# Patient Record
Sex: Female | Born: 1970 | Race: White | Hispanic: No | Marital: Married | Smoking: Former smoker
Health system: Southern US, Community
[De-identification: ages and names within clinical notes are randomized; demographics above are authoritative.]

## PROBLEM LIST (undated history)

## (undated) DIAGNOSIS — R51 Headache: Principal | ICD-10-CM

## (undated) DIAGNOSIS — S161XXA Strain of muscle, fascia and tendon at neck level, initial encounter: Secondary | ICD-10-CM

## (undated) HISTORY — PX: AUGMENTATION MAMMAPLASTY: SUR837

## (undated) HISTORY — PX: RHINOPLASTY: SUR1284

## (undated) HISTORY — DX: Strain of muscle, fascia and tendon at neck level, initial encounter: S16.1XXA

## (undated) HISTORY — DX: Headache: R51

---

## 1998-05-20 ENCOUNTER — Other Ambulatory Visit: Admission: RE | Admit: 1998-05-20 | Discharge: 1998-05-20 | Payer: Self-pay | Admitting: Obstetrics and Gynecology

## 1999-09-02 ENCOUNTER — Other Ambulatory Visit: Admission: RE | Admit: 1999-09-02 | Discharge: 1999-09-02 | Payer: Self-pay | Admitting: Obstetrics and Gynecology

## 2000-09-04 ENCOUNTER — Other Ambulatory Visit: Admission: RE | Admit: 2000-09-04 | Discharge: 2000-09-04 | Payer: Self-pay | Admitting: Obstetrics and Gynecology

## 2001-09-06 ENCOUNTER — Other Ambulatory Visit: Admission: RE | Admit: 2001-09-06 | Discharge: 2001-09-06 | Payer: Self-pay | Admitting: Obstetrics and Gynecology

## 2001-11-29 ENCOUNTER — Emergency Department (HOSPITAL_COMMUNITY): Admission: EM | Admit: 2001-11-29 | Discharge: 2001-11-29 | Payer: Self-pay | Admitting: Emergency Medicine

## 2002-09-24 ENCOUNTER — Other Ambulatory Visit: Admission: RE | Admit: 2002-09-24 | Discharge: 2002-09-24 | Payer: Self-pay | Admitting: Obstetrics and Gynecology

## 2002-11-07 ENCOUNTER — Encounter: Admission: RE | Admit: 2002-11-07 | Discharge: 2002-11-07 | Payer: Self-pay | Admitting: Obstetrics and Gynecology

## 2002-11-07 ENCOUNTER — Encounter: Payer: Self-pay | Admitting: Obstetrics and Gynecology

## 2003-11-14 ENCOUNTER — Other Ambulatory Visit: Admission: RE | Admit: 2003-11-14 | Discharge: 2003-11-14 | Payer: Self-pay | Admitting: Obstetrics and Gynecology

## 2004-12-13 ENCOUNTER — Other Ambulatory Visit: Admission: RE | Admit: 2004-12-13 | Discharge: 2004-12-13 | Payer: Self-pay | Admitting: Obstetrics and Gynecology

## 2005-12-20 ENCOUNTER — Other Ambulatory Visit: Admission: RE | Admit: 2005-12-20 | Discharge: 2005-12-20 | Payer: Self-pay | Admitting: Obstetrics and Gynecology

## 2010-06-11 ENCOUNTER — Encounter: Admission: RE | Admit: 2010-06-11 | Discharge: 2010-06-11 | Payer: Self-pay | Admitting: Obstetrics and Gynecology

## 2010-10-09 ENCOUNTER — Encounter: Payer: Self-pay | Admitting: Family Medicine

## 2011-05-12 ENCOUNTER — Other Ambulatory Visit: Payer: Self-pay | Admitting: Obstetrics and Gynecology

## 2011-05-12 DIAGNOSIS — Z1231 Encounter for screening mammogram for malignant neoplasm of breast: Secondary | ICD-10-CM

## 2011-06-14 ENCOUNTER — Ambulatory Visit
Admission: RE | Admit: 2011-06-14 | Discharge: 2011-06-14 | Disposition: A | Discharge status: Home / Self Care | Payer: BC Managed Care – PPO | Source: Ambulatory Visit | Attending: Obstetrics and Gynecology | Admitting: Obstetrics and Gynecology

## 2011-06-14 DIAGNOSIS — Z1231 Encounter for screening mammogram for malignant neoplasm of breast: Secondary | ICD-10-CM

## 2012-05-22 ENCOUNTER — Other Ambulatory Visit: Payer: Self-pay | Admitting: Obstetrics and Gynecology

## 2012-05-22 DIAGNOSIS — Z1231 Encounter for screening mammogram for malignant neoplasm of breast: Secondary | ICD-10-CM

## 2012-06-14 ENCOUNTER — Ambulatory Visit
Admission: RE | Admit: 2012-06-14 | Discharge: 2012-06-14 | Disposition: A | Discharge status: Home / Self Care | Payer: BC Managed Care – PPO | Source: Ambulatory Visit | Attending: Obstetrics and Gynecology | Admitting: Obstetrics and Gynecology

## 2012-06-14 DIAGNOSIS — Z1231 Encounter for screening mammogram for malignant neoplasm of breast: Secondary | ICD-10-CM

## 2013-07-01 ENCOUNTER — Other Ambulatory Visit: Payer: Self-pay

## 2013-07-01 DIAGNOSIS — Z1231 Encounter for screening mammogram for malignant neoplasm of breast: Secondary | ICD-10-CM

## 2013-07-22 ENCOUNTER — Ambulatory Visit
Admission: RE | Admit: 2013-07-22 | Discharge: 2013-07-22 | Disposition: A | Discharge status: Home / Self Care | Payer: BC Managed Care – PPO | Source: Ambulatory Visit

## 2013-07-22 DIAGNOSIS — Z1231 Encounter for screening mammogram for malignant neoplasm of breast: Secondary | ICD-10-CM

## 2014-06-24 ENCOUNTER — Other Ambulatory Visit: Payer: Self-pay

## 2014-06-24 DIAGNOSIS — Z1239 Encounter for other screening for malignant neoplasm of breast: Secondary | ICD-10-CM

## 2014-07-11 ENCOUNTER — Other Ambulatory Visit: Payer: Self-pay

## 2014-07-11 DIAGNOSIS — Z1231 Encounter for screening mammogram for malignant neoplasm of breast: Secondary | ICD-10-CM

## 2014-08-25 ENCOUNTER — Ambulatory Visit
Admission: RE | Admit: 2014-08-25 | Discharge: 2014-08-25 | Disposition: A | Discharge status: Home / Self Care | Payer: BC Managed Care – PPO | Source: Ambulatory Visit

## 2014-08-25 DIAGNOSIS — Z1231 Encounter for screening mammogram for malignant neoplasm of breast: Secondary | ICD-10-CM

## 2014-08-29 ENCOUNTER — Other Ambulatory Visit: Payer: Self-pay | Admitting: Family Medicine

## 2014-08-29 DIAGNOSIS — N644 Mastodynia: Secondary | ICD-10-CM

## 2014-09-10 ENCOUNTER — Other Ambulatory Visit: Payer: BC Managed Care – PPO

## 2014-09-18 ENCOUNTER — Ambulatory Visit
Admission: RE | Admit: 2014-09-18 | Discharge: 2014-09-18 | Disposition: A | Discharge status: Home / Self Care | Payer: BC Managed Care – PPO | Source: Ambulatory Visit | Attending: Family Medicine | Admitting: Family Medicine

## 2014-09-18 ENCOUNTER — Other Ambulatory Visit: Payer: Self-pay | Admitting: Family Medicine

## 2014-09-18 DIAGNOSIS — N644 Mastodynia: Secondary | ICD-10-CM

## 2014-12-18 ENCOUNTER — Other Ambulatory Visit: Payer: Self-pay

## 2014-12-18 DIAGNOSIS — D241 Benign neoplasm of right breast: Secondary | ICD-10-CM

## 2014-12-18 DIAGNOSIS — N6310 Unspecified lump in the right breast, unspecified quadrant: Secondary | ICD-10-CM

## 2015-03-11 ENCOUNTER — Ambulatory Visit (INDEPENDENT_AMBULATORY_CARE_PROVIDER_SITE_OTHER): Payer: BLUE CROSS/BLUE SHIELD | Admitting: Neurology

## 2015-03-11 ENCOUNTER — Encounter: Payer: Self-pay | Admitting: Neurology

## 2015-03-11 VITALS — BP 104/70 | HR 78 | Ht 64.0 in | Wt 141.2 lb

## 2015-03-11 DIAGNOSIS — S161XXA Strain of muscle, fascia and tendon at neck level, initial encounter: Secondary | ICD-10-CM | POA: Insufficient documentation

## 2015-03-11 DIAGNOSIS — M542 Cervicalgia: Secondary | ICD-10-CM

## 2015-03-11 DIAGNOSIS — R51 Headache: Secondary | ICD-10-CM | POA: Diagnosis not present

## 2015-03-11 DIAGNOSIS — R519 Headache, unspecified: Secondary | ICD-10-CM

## 2015-03-11 HISTORY — DX: Strain of muscle, fascia and tendon at neck level, initial encounter: S16.1XXA

## 2015-03-11 HISTORY — DX: Headache, unspecified: R51.9

## 2015-03-11 NOTE — Progress Notes (Signed)
Reason for visit: Neck pain  Referring physician: Dr. Leota Jacobsen is a 44 y.o. female  History of present illness:  Ms. Vandagriff is a 44 year old right-handed white female with a history of neck discomfort going back several years. Within the last year, the discomfort has significant increased. The patient reports pain at the base of the neck, and the pain may alternate from one side to the next. The pain is usually not on both sides at the same time. She indicates that the pain is directly related to stress at work. The pain will begin on Thursday, and continue until Saturday morning. The pain is associated with spasm and trigger points at the base of the skull, the patient will have shooting and tingling pain into the maxillary areas. She will have some discomfort down into the shoulder. She indicates that if she is using the computer for long times, this will bring on discomfort. If she is active, up and walking, this improves the symptoms. If she goes on vacation, the symptoms will reduce significantly. She denies any pain down the arms. 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 She has been seen by [facility], and this has been beneficial. She may use heat or ice on the neck with some benefit, and she also uses Advil and she may take some occasional hydrocodone or alprazolam. Medications such as Flexeril resulted in too much drowsiness. Sunday through Wednesday, the patient is totally asymptomatic. She has undergone MRI evaluation of the cervical spine, this study shows congenitally short pedicles with some degenerative disc disease at the C5-6 and C6-7 levels, some left-sided neuroforaminal stenosis is noted, no definite nerve root impingement is seen. She comes to this office for an evaluation. She is being considered for epidural steroid injections.  Past Medical History  Diagnosis Date  . Headache disorder  03/11/2015  . Cervical strain 03/11/2015    Past Surgical History  Procedure Laterality Date  . Rhinoplasty      Family History  Problem Relation Age of Onset  . Stroke Maternal Grandmother   . Colon cancer Paternal Grandmother     Social history:  reports that she quit smoking about 27 years ago. She does not have any smokeless tobacco history on file. She reports that she does not drink alcohol or use illicit drugs.  Medications:  Prior to Admission medications   Medication Sig Start Date End Date Taking? Authorizing Provider  ALPRAZolam Duanne Moron) 0.5 MG tablet Take 1-1.5 tablets by mouth at bedtime. 01/09/15  Yes Historical Provider, MD  HYDROcodone-acetaminophen (NORCO/VICODIN) 5-325 MG per tablet Take 1-2 tablets by mouth at bedtime. 01/29/15  Yes Historical Provider, MD  Ibuprofen (ADVIL) 200 MG CAPS Take 600 mg by mouth 2 (two) times daily.   Yes Historical Provider, MD  mometasone (NASONEX) 50 MCG/ACT nasal spray Place 2 sprays into the nose daily.   Yes Historical Provider, MD  spironolactone (ALDACTONE) 50 MG tablet Take 150 mg by mouth daily.   Yes Historical Provider, MD      Allergies  Allergen Reactions  . Aleve [Naproxen Sodium]   . Epinephrine     ROS:  Out of a complete 14 system review of symptoms, the patient complains only of the following symptoms, and all other reviewed systems are negative.  Neck pain, tingling  Blood pressure 104/70, pulse 78, height 5\' 4"  (1.626 m), weight 141 lb 3.2 oz (64.048 kg).  Physical Exam  General: The patient is alert and cooperative at the time of the examination.  Eyes: Pupils are equal, round, and reactive to light. Discs are flat bilaterally.  Neck: The neck is supple, no carotid bruits are noted.  Respiratory: The respiratory examination is clear.  Cardiovascular: The cardiovascular examination reveals a regular rate and rhythm, no obvious murmurs or rubs are noted.  Neuromuscular: Range of movement of the  cervical spine is full. Bilateral crepitus is noted in the temporomandibular joints.  Skin: Extremities are without significant edema.  Neurologic Exam  Mental status: The patient is alert and oriented x 3 at the time of the examination. The patient has apparent normal recent and remote memory, with an apparently normal attention span and concentration ability.  Cranial nerves: Facial symmetry is present. There is good sensation of the face to pinprick and soft touch bilaterally. The strength of the facial muscles and the muscles to head turning and shoulder shrug are normal bilaterally. Speech is well enunciated, no aphasia or dysarthria is noted. Extraocular movements are full. Visual fields are full. The tongue is midline, and the patient has symmetric elevation of the soft palate. No obvious hearing deficits are noted.  Motor: The motor testing reveals 5 over 5 strength of all 4 extremities. Good symmetric motor tone is noted throughout.  Sensory: Sensory testing is intact to pinprick, soft touch, vibration sensation, and position sense on all 4 extremities. No evidence of extinction is noted.  Coordination: Cerebellar testing reveals good finger-nose-finger and heel-to-shin bilaterally.  Gait and station: Gait is normal. Tandem gait is normal. Romberg is negative. No drift is seen.  Reflexes: Deep tendon reflexes are symmetric and normal bilaterally. Toes are downgoing bilaterally.   Assessment/Plan:  1. Neuromuscular discomfort, cervical strain  2. Mild cervical spondylosis  The patient is having neuromuscular discomfort associated with her work stress. I have recommended that she frequently stretch throughout the day while working. I recommended going on low-dose gabapentin, and the patient may go on to get an epidural steroid injection. The patient does not wish to go on oral daily medications, but she will contact me if she changes her mind. If the symptoms are not improving, MRI  of the brain may be done in the future.  Jill Alexanders MD 03/11/2015 8:19 PM  [facility] [address] [place], [place] [address]  Phone [phone] Fax [phone]

## 2015-03-11 NOTE — Patient Instructions (Signed)

## 2015-03-17 ENCOUNTER — Ambulatory Visit: Payer: BLUE CROSS/BLUE SHIELD | Admitting: Neurology

## 2015-03-26 ENCOUNTER — Other Ambulatory Visit: Payer: Self-pay | Admitting: Obstetrics and Gynecology

## 2015-03-26 DIAGNOSIS — N6310 Unspecified lump in the right breast, unspecified quadrant: Secondary | ICD-10-CM

## 2015-03-27 ENCOUNTER — Ambulatory Visit
Admission: RE | Admit: 2015-03-27 | Discharge: 2015-03-27 | Disposition: A | Discharge status: Home / Self Care | Payer: BLUE CROSS/BLUE SHIELD | Source: Ambulatory Visit

## 2015-03-27 ENCOUNTER — Other Ambulatory Visit: Payer: Self-pay | Admitting: Obstetrics and Gynecology

## 2015-03-27 DIAGNOSIS — N6310 Unspecified lump in the right breast, unspecified quadrant: Secondary | ICD-10-CM

## 2015-08-26 ENCOUNTER — Other Ambulatory Visit: Payer: Self-pay

## 2015-08-26 DIAGNOSIS — Z1231 Encounter for screening mammogram for malignant neoplasm of breast: Secondary | ICD-10-CM

## 2015-09-24 ENCOUNTER — Ambulatory Visit: Payer: BLUE CROSS/BLUE SHIELD

## 2015-10-23 LAB — LIPID PANEL
Cholesterol: 198 (ref 0–200)
HDL: 42 (ref 35–70)
LDL CALC: 130
Triglycerides: 131 (ref 40–160)

## 2015-10-23 LAB — BASIC METABOLIC PANEL
BUN: 15 (ref 4–21)
Creatinine: 0.7 (ref 0.5–1.1)
GLUCOSE: 94
POTASSIUM: 4.4 (ref 3.4–5.3)
Sodium: 136 — AB (ref 137–147)

## 2015-10-23 LAB — HEPATIC FUNCTION PANEL
ALT: 12 (ref 7–35)
AST: 15 (ref 13–35)
Alkaline Phosphatase: 51 (ref 25–125)
Bilirubin, Total: 0.5

## 2015-11-04 ENCOUNTER — Ambulatory Visit
Admission: RE | Admit: 2015-11-04 | Discharge: 2015-11-04 | Disposition: A | Discharge status: Home / Self Care | Payer: BLUE CROSS/BLUE SHIELD | Source: Ambulatory Visit

## 2015-11-04 DIAGNOSIS — Z1231 Encounter for screening mammogram for malignant neoplasm of breast: Secondary | ICD-10-CM

## 2016-09-28 ENCOUNTER — Other Ambulatory Visit: Payer: Self-pay | Admitting: Obstetrics and Gynecology

## 2016-09-28 DIAGNOSIS — Z1231 Encounter for screening mammogram for malignant neoplasm of breast: Secondary | ICD-10-CM

## 2016-10-19 ENCOUNTER — Telehealth (INDEPENDENT_AMBULATORY_CARE_PROVIDER_SITE_OTHER): Payer: Self-pay | Admitting: Orthopaedic Surgery

## 2016-10-19 ENCOUNTER — Other Ambulatory Visit (INDEPENDENT_AMBULATORY_CARE_PROVIDER_SITE_OTHER): Payer: Self-pay | Admitting: Orthopaedic Surgery

## 2016-10-19 MED ORDER — HYDROCODONE-ACETAMINOPHEN 5-325 MG PO TABS: 1.0000 | ORAL_TABLET | Freq: Two times a day (BID) | ORAL | 0 refills | Status: DC | PRN

## 2016-10-19 NOTE — Telephone Encounter (Signed)
LMOM for patient of the below message  

## 2016-10-19 NOTE — Telephone Encounter (Signed)
Can come and pick up one more, but next time, she will have to be seen as a patient due to the new narcotic prescribing laws

## 2016-10-19 NOTE — Telephone Encounter (Signed)
Please advise 

## 2016-10-19 NOTE — Telephone Encounter (Signed)
Patient called to request a medication refill on her pain medicine Vicodin.  She also wanted you to know that she took your advice and switched to a less stressful job, but still having some issues that she can't shake.

## 2016-10-19 NOTE — Telephone Encounter (Signed)
Sorry Patricia Duke's Cb#[phone].  Sorry I left her number off the last message.  Thank you.

## 2016-11-23 ENCOUNTER — Ambulatory Visit
Admission: RE | Admit: 2016-11-23 | Discharge: 2016-11-23 | Disposition: A | Discharge status: Home / Self Care | Payer: 59 | Source: Ambulatory Visit | Attending: Obstetrics and Gynecology | Admitting: Obstetrics and Gynecology

## 2016-11-23 ENCOUNTER — Other Ambulatory Visit: Payer: Self-pay | Admitting: Obstetrics and Gynecology

## 2016-11-23 DIAGNOSIS — Z1231 Encounter for screening mammogram for malignant neoplasm of breast: Secondary | ICD-10-CM

## 2017-01-18 DIAGNOSIS — Z6824 Body mass index (BMI) 24.0-24.9, adult: Secondary | ICD-10-CM | POA: Diagnosis not present

## 2017-01-18 DIAGNOSIS — Z01419 Encounter for gynecological examination (general) (routine) without abnormal findings: Secondary | ICD-10-CM | POA: Diagnosis not present

## 2017-02-21 ENCOUNTER — Ambulatory Visit (INDEPENDENT_AMBULATORY_CARE_PROVIDER_SITE_OTHER): Payer: 59

## 2017-02-21 ENCOUNTER — Other Ambulatory Visit (INDEPENDENT_AMBULATORY_CARE_PROVIDER_SITE_OTHER): Payer: Self-pay

## 2017-02-21 ENCOUNTER — Telehealth (INDEPENDENT_AMBULATORY_CARE_PROVIDER_SITE_OTHER): Payer: Self-pay | Admitting: Orthopaedic Surgery

## 2017-02-21 ENCOUNTER — Ambulatory Visit (INDEPENDENT_AMBULATORY_CARE_PROVIDER_SITE_OTHER): Payer: 59 | Admitting: Orthopaedic Surgery

## 2017-02-21 DIAGNOSIS — M5441 Lumbago with sciatica, right side: Secondary | ICD-10-CM | POA: Diagnosis not present

## 2017-02-21 DIAGNOSIS — M542 Cervicalgia: Secondary | ICD-10-CM

## 2017-02-21 DIAGNOSIS — M5442 Lumbago with sciatica, left side: Secondary | ICD-10-CM

## 2017-02-21 MED ORDER — HYDROCODONE-ACETAMINOPHEN 5-325 MG PO TABS: 1.0000 | ORAL_TABLET | Freq: Two times a day (BID) | ORAL | 0 refills | Status: AC | PRN

## 2017-02-21 MED ORDER — GABAPENTIN 300 MG PO CAPS: 300.0000 mg | ORAL_CAPSULE | Freq: Every day | ORAL | 0 refills | Status: DC

## 2017-02-21 NOTE — Progress Notes (Signed)
Office Visit Note   Patient: Patricia Duke           Date of Birth: [birth year]           MRN: [id number] Visit Date: 02/21/2017              Requested by: Kathyrn Lass, [place], [place] [address] PCP: Kathyrn Lass, MD   Assessment & Plan: Visit Diagnoses:  1. Acute right-sided low back pain with right-sided sciatica   2. Neck pain     Plan: Given her worsening pain combined with now the significant radicular symptoms going down her right leg and MRI is warranted to rule out herniated disc. I'm certainly concerned of her plain films showing degenerative disc disease at L4-L5 and the fact that I see narrowing of of the foramina at that level that she is experiencing nerve compression. She is already tried and failed all forms of conservative treatment including rest, ice, heat, anti-inflammatories when she can tolerate these without GI issues. She's also been to a chiropractor all times. At this point I feel that she would benefit from an MRI to see where the nerve is getting compressed so we can design intervention for her. I will try 300 mg of Neurontin at night and have her take this about a week which she can go up to twice a day if needed. I did refill her hydrocodone but only gave her 40 pills and counseled her about narcotics use. We'll have her follow-up after the MRI of her lumbar spine.  Follow-Up Instructions: Return in about 2 weeks (around 03/07/2017).   Orders:  Orders Placed This Encounter  Procedures  . XR Lumbar Spine 2-3 Views  . XR Cervical Spine 2 or 3 views   Meds ordered this encounter  Medications  . gabapentin (NEURONTIN) 300 MG capsule    Sig: Take 1 capsule (300 mg total) by mouth at bedtime.    Dispense:  60 capsule    Refill:  0  . HYDROcodone-acetaminophen (NORCO/VICODIN) 5-325 MG tablet    Sig: Take 1 tablet by mouth 2 (two) times daily as needed for moderate pain.    Dispense:  40 tablet    Refill:  0      Procedures: No  procedures performed   Clinical Data: No additional findings.   Subjective: No chief complaint on file. Patient is well-known to our practice. She is seen in my self as well as a partner of mine for many years now. She's had chronic neck issues and chronic headaches but now she's been having problems with low back pain and radicular symptoms going down her right leg. This is been slowly getting worse with time. She has tried rest, ice, heat and activity modification. She goes to a chiropractor regularly and has therapy as well. Her pain is getting worse. She tries anti-inflammatories but it does cause significant gastritis and she said initially with ulcers in the past. She does take hydrocodone on occasion and we reviewed the EEA data base and does not show she is getting anywhere except for here and we don't feel this but very limited times. She describes the pain going down her right leg is in her backside and goes down the side of her leg on the right side. She is developing some weakness and feel like the legs giving way as well. She is also just chronic neck issue and pain in general she's had TMJ in the past as well  as again chronic headaches which she sees a specialist for. She does take Xanax and Skelaxin. She denies any change in bowel or bladder function.  HPI  Review of Systems She currently denies any short of breath or chest pain. She denies any fever, chills, nausea, vomiting.  Objective: Vital Signs: There were no vitals taken for this visit.  Physical Exam She is alert and oriented 3 and in no acute distress Ortho Exam On examination of her lumbar spine she has significantly limited flexion-extension. Her hamstrings are incredibly tight and when she bends over even being a thin individual she can only touch about her ankles. Her extension also causes a significant amount of pain and is limited. She has a positive straight leg raise to the right side. She has pain across her  lower aspect lumbar spine to palpation of both the facet joints of L4-5 and L5-S1. She has subjective numbness down the lateral aspect of her right leg. Specialty Comments:  No specialty comments available.  Imaging: Xr Cervical Spine 2 Or 3 Views  Result Date: 02/21/2017 An AP and lateral of the cervical spine shows some mild degenerative changes. On the lateral view she has some loss of her lumbar lordosis and on the AP view you can see some arthritic changes and facet joints of the lower aspect of cervical spine to the left.  Xr Lumbar Spine 2-3 Views  Result Date: 02/21/2017 An AP and lateral lumbar spine shows significant degenerative disc disease at L4-L5.    PMFS History: Patient Active Problem List   Diagnosis Date Noted  . Headache disorder 03/11/2015  . Cervical strain 03/11/2015   Past Medical History:  Diagnosis Date  . Cervical strain 03/11/2015  . Headache disorder 03/11/2015    Family History  Problem Relation Age of Onset  . Stroke Maternal Grandmother   . Colon cancer Paternal Grandmother   . Breast cancer Neg Hx     Past Surgical History:  Procedure Laterality Date  . RHINOPLASTY     Social History   Occupational History  . [employer]    Social History Main Topics  . Smoking status: Former Smoker    Quit date: 09/20/1987  . Smokeless tobacco: Not on file  . Alcohol use No  . Drug use: No  . Sexual activity: Not on file

## 2017-02-21 NOTE — Telephone Encounter (Signed)
Patient is requesting a open MRI she is very claustrophobic. CB # [phone]

## 2017-02-22 NOTE — Telephone Encounter (Signed)
done

## 2017-03-07 ENCOUNTER — Other Ambulatory Visit: Payer: 59

## 2017-03-08 ENCOUNTER — Ambulatory Visit (INDEPENDENT_AMBULATORY_CARE_PROVIDER_SITE_OTHER): Payer: 59 | Admitting: Orthopaedic Surgery

## 2017-03-27 ENCOUNTER — Ambulatory Visit (INDEPENDENT_AMBULATORY_CARE_PROVIDER_SITE_OTHER): Payer: 59 | Admitting: Orthopaedic Surgery

## 2017-04-05 DIAGNOSIS — L689 Hypertrichosis, unspecified: Secondary | ICD-10-CM | POA: Diagnosis not present

## 2017-04-05 DIAGNOSIS — K219 Gastro-esophageal reflux disease without esophagitis: Secondary | ICD-10-CM | POA: Diagnosis not present

## 2017-04-05 DIAGNOSIS — E782 Mixed hyperlipidemia: Secondary | ICD-10-CM | POA: Diagnosis not present

## 2017-04-05 DIAGNOSIS — G4700 Insomnia, unspecified: Secondary | ICD-10-CM | POA: Diagnosis not present

## 2017-04-05 DIAGNOSIS — Z79899 Other long term (current) drug therapy: Secondary | ICD-10-CM | POA: Diagnosis not present

## 2017-04-05 DIAGNOSIS — M542 Cervicalgia: Secondary | ICD-10-CM | POA: Diagnosis not present

## 2017-04-05 DIAGNOSIS — M545 Low back pain: Secondary | ICD-10-CM | POA: Diagnosis not present

## 2017-04-05 LAB — HEPATIC FUNCTION PANEL
ALK PHOS: 55 (ref 25–125)
ALT: 13 (ref 7–35)
AST: 18 (ref 13–35)
Bilirubin, Total: 0.3

## 2017-04-05 LAB — BASIC METABOLIC PANEL
BUN: 12 (ref 4–21)
Creatinine: 0.7 (ref 0.5–1.1)
Glucose: 99
Potassium: 5 (ref 3.4–5.3)
Sodium: 138 (ref 137–147)

## 2017-04-05 LAB — LIPID PANEL
CHOLESTEROL: 228 — AB (ref 0–200)
HDL: 50 (ref 35–70)
LDL CALC: 158
TRIGLYCERIDES: 101 (ref 40–160)

## 2017-04-11 ENCOUNTER — Other Ambulatory Visit: Payer: 59

## 2017-04-20 ENCOUNTER — Other Ambulatory Visit (INDEPENDENT_AMBULATORY_CARE_PROVIDER_SITE_OTHER): Payer: Self-pay | Admitting: Orthopaedic Surgery

## 2017-04-20 NOTE — Telephone Encounter (Signed)
Please advise 

## 2017-04-24 ENCOUNTER — Telehealth (INDEPENDENT_AMBULATORY_CARE_PROVIDER_SITE_OTHER): Payer: Self-pay | Admitting: Orthopaedic Surgery

## 2017-04-24 NOTE — Telephone Encounter (Signed)
Done. Patient notified and is aware of $5 fee.

## 2017-04-24 NOTE — Telephone Encounter (Signed)
Patient called needing X-Rays from her June visit. Patient advised will pick up. The number to contact patient is [phone]

## 2017-05-03 DIAGNOSIS — M5136 Other intervertebral disc degeneration, lumbar region: Secondary | ICD-10-CM | POA: Diagnosis not present

## 2017-05-03 DIAGNOSIS — G8929 Other chronic pain: Secondary | ICD-10-CM | POA: Diagnosis not present

## 2017-05-03 DIAGNOSIS — M47812 Spondylosis without myelopathy or radiculopathy, cervical region: Secondary | ICD-10-CM | POA: Diagnosis not present

## 2017-05-03 DIAGNOSIS — M47816 Spondylosis without myelopathy or radiculopathy, lumbar region: Secondary | ICD-10-CM | POA: Diagnosis not present

## 2017-05-03 DIAGNOSIS — M791 Myalgia: Secondary | ICD-10-CM | POA: Diagnosis not present

## 2017-05-03 DIAGNOSIS — Z79891 Long term (current) use of opiate analgesic: Secondary | ICD-10-CM | POA: Diagnosis not present

## 2017-05-23 DIAGNOSIS — M47816 Spondylosis without myelopathy or radiculopathy, lumbar region: Secondary | ICD-10-CM | POA: Diagnosis not present

## 2017-05-23 DIAGNOSIS — M5136 Other intervertebral disc degeneration, lumbar region: Secondary | ICD-10-CM | POA: Diagnosis not present

## 2017-05-23 DIAGNOSIS — Z886 Allergy status to analgesic agent status: Secondary | ICD-10-CM | POA: Diagnosis not present

## 2017-06-28 DIAGNOSIS — M5136 Other intervertebral disc degeneration, lumbar region: Secondary | ICD-10-CM | POA: Diagnosis not present

## 2017-06-28 DIAGNOSIS — M47816 Spondylosis without myelopathy or radiculopathy, lumbar region: Secondary | ICD-10-CM | POA: Diagnosis not present

## 2017-06-28 DIAGNOSIS — Z79891 Long term (current) use of opiate analgesic: Secondary | ICD-10-CM | POA: Diagnosis not present

## 2017-06-28 DIAGNOSIS — G8929 Other chronic pain: Secondary | ICD-10-CM | POA: Diagnosis not present

## 2017-06-28 DIAGNOSIS — M7918 Myalgia, other site: Secondary | ICD-10-CM | POA: Diagnosis not present

## 2017-06-28 DIAGNOSIS — M47812 Spondylosis without myelopathy or radiculopathy, cervical region: Secondary | ICD-10-CM | POA: Diagnosis not present

## 2017-10-17 ENCOUNTER — Other Ambulatory Visit: Payer: Self-pay | Admitting: Obstetrics and Gynecology

## 2017-10-17 DIAGNOSIS — Z1231 Encounter for screening mammogram for malignant neoplasm of breast: Secondary | ICD-10-CM

## 2017-11-14 DIAGNOSIS — D229 Melanocytic nevi, unspecified: Secondary | ICD-10-CM | POA: Diagnosis not present

## 2017-11-14 DIAGNOSIS — L821 Other seborrheic keratosis: Secondary | ICD-10-CM | POA: Diagnosis not present

## 2017-11-22 DIAGNOSIS — M47892 Other spondylosis, cervical region: Secondary | ICD-10-CM | POA: Diagnosis not present

## 2017-11-22 DIAGNOSIS — M47896 Other spondylosis, lumbar region: Secondary | ICD-10-CM | POA: Diagnosis not present

## 2017-11-22 DIAGNOSIS — M7918 Myalgia, other site: Secondary | ICD-10-CM | POA: Diagnosis not present

## 2017-11-22 DIAGNOSIS — M5136 Other intervertebral disc degeneration, lumbar region: Secondary | ICD-10-CM | POA: Diagnosis not present

## 2017-11-22 DIAGNOSIS — M47812 Spondylosis without myelopathy or radiculopathy, cervical region: Secondary | ICD-10-CM | POA: Diagnosis not present

## 2017-11-22 DIAGNOSIS — G8929 Other chronic pain: Secondary | ICD-10-CM | POA: Diagnosis not present

## 2017-11-22 DIAGNOSIS — M542 Cervicalgia: Secondary | ICD-10-CM | POA: Diagnosis not present

## 2017-11-22 DIAGNOSIS — M5186 Other intervertebral disc disorders, lumbar region: Secondary | ICD-10-CM | POA: Diagnosis not present

## 2017-11-22 DIAGNOSIS — G894 Chronic pain syndrome: Secondary | ICD-10-CM | POA: Diagnosis not present

## 2017-11-22 DIAGNOSIS — M50322 Other cervical disc degeneration at C5-C6 level: Secondary | ICD-10-CM | POA: Diagnosis not present

## 2017-11-22 DIAGNOSIS — M47816 Spondylosis without myelopathy or radiculopathy, lumbar region: Secondary | ICD-10-CM | POA: Diagnosis not present

## 2017-11-22 DIAGNOSIS — M4182 Other forms of scoliosis, cervical region: Secondary | ICD-10-CM | POA: Diagnosis not present

## 2017-11-22 DIAGNOSIS — Z79891 Long term (current) use of opiate analgesic: Secondary | ICD-10-CM | POA: Diagnosis not present

## 2017-12-05 ENCOUNTER — Encounter: Payer: Self-pay | Admitting: Family Medicine

## 2017-12-05 ENCOUNTER — Ambulatory Visit: Payer: 59 | Admitting: Family Medicine

## 2017-12-05 VITALS — BP 102/62 | HR 90 | Ht 64.0 in | Wt 138.8 lb

## 2017-12-05 DIAGNOSIS — M545 Low back pain, unspecified: Secondary | ICD-10-CM | POA: Insufficient documentation

## 2017-12-05 DIAGNOSIS — G8929 Other chronic pain: Secondary | ICD-10-CM

## 2017-12-05 DIAGNOSIS — R69 Illness, unspecified: Secondary | ICD-10-CM | POA: Diagnosis not present

## 2017-12-05 DIAGNOSIS — F5101 Primary insomnia: Secondary | ICD-10-CM

## 2017-12-05 DIAGNOSIS — Z0000 Encounter for general adult medical examination without abnormal findings: Secondary | ICD-10-CM

## 2017-12-05 DIAGNOSIS — E7800 Pure hypercholesterolemia, unspecified: Secondary | ICD-10-CM

## 2017-12-05 DIAGNOSIS — F1320 Sedative, hypnotic or anxiolytic dependence, uncomplicated: Secondary | ICD-10-CM

## 2017-12-05 LAB — LIPID PANEL
CHOLESTEROL: 264 mg/dL — AB (ref 0–200)
HDL: 45.6 mg/dL (ref >39.00)
LDL CALC: 199 mg/dL — AB (ref 0–99)
NonHDL: 217.99
TRIGLYCERIDES: 94 mg/dL (ref 0.0–149.0)
Total CHOL/HDL Ratio: 6
VLDL: 18.8 mg/dL (ref 0.0–40.0)

## 2017-12-05 LAB — URINALYSIS, ROUTINE W REFLEX MICROSCOPIC
BILIRUBIN URINE: NEGATIVE
KETONES UR: NEGATIVE
Leukocytes, UA: NEGATIVE
NITRITE: NEGATIVE
Specific Gravity, Urine: 1.025 (ref 1.000–1.030)
Total Protein, Urine: NEGATIVE
Urine Glucose: NEGATIVE
Urobilinogen, UA: 0.2 (ref 0.0–1.0)
WBC, UA: NONE SEEN (ref 0–?)
pH: 5.5 (ref 5.0–8.0)

## 2017-12-05 LAB — CBC
HEMATOCRIT: 40.9 % (ref 36.0–46.0)
HEMOGLOBIN: 13.8 g/dL (ref 12.0–15.0)
MCHC: 33.7 g/dL (ref 30.0–36.0)
MCV: 97.9 fl (ref 78.0–100.0)
Platelets: 286 10*3/uL (ref 150.0–400.0)
RBC: 4.18 Mil/uL (ref 3.87–5.11)
RDW: 12.3 % (ref 11.5–15.5)
WBC: 5.5 10*3/uL (ref 4.0–10.5)

## 2017-12-05 LAB — COMPREHENSIVE METABOLIC PANEL
ALT: 10 U/L (ref 0–35)
AST: 12 U/L (ref 0–37)
Albumin: 4.2 g/dL (ref 3.5–5.2)
Alkaline Phosphatase: 46 U/L (ref 39–117)
BUN: 16 mg/dL (ref 6–23)
CALCIUM: 9.4 mg/dL (ref 8.4–10.5)
CO2: 27 meq/L (ref 19–32)
CREATININE: 0.63 mg/dL (ref 0.40–1.20)
Chloride: 103 mEq/L (ref 96–112)
GFR: 107.38 mL/min (ref >60.00)
Glucose, Bld: 113 mg/dL — ABNORMAL HIGH (ref 70–99)
POTASSIUM: 4.6 meq/L (ref 3.5–5.1)
Sodium: 136 mEq/L (ref 135–145)
Total Bilirubin: 0.3 mg/dL (ref 0.2–1.2)
Total Protein: 6.8 g/dL (ref 6.0–8.3)

## 2017-12-05 LAB — TSH: TSH: 1.19 u[IU]/mL (ref 0.35–4.50)

## 2017-12-05 MED ORDER — ALPRAZOLAM 0.5 MG PO TABS: ORAL_TABLET | ORAL | 0 refills | Status: DC

## 2017-12-05 NOTE — Patient Instructions (Addendum)
Preventive Care 40-64 Years, Female Preventive care refers to lifestyle choices and visits with your health care provider that can promote health and wellness. What does preventive care include?  A yearly physical exam. This is also called an annual well check.  Dental exams once or twice a year.  Routine eye exams. Ask your health care provider how often you should have your eyes checked.  Personal lifestyle choices, including: ? Daily care of your teeth and gums. ? Regular physical activity. ? Eating a healthy diet. ? Avoiding tobacco and drug use. ? Limiting alcohol use. ? Practicing safe sex. ? Taking low-dose aspirin daily starting at age 58. ? Taking vitamin and mineral supplements as recommended by your health care provider. What happens during an annual well check? The services and screenings done by your health care provider during your annual well check will depend on your age, overall health, lifestyle risk factors, and family history of disease. Counseling Your health care provider may ask you questions about your:  Alcohol use.  Tobacco use.  Drug use.  Emotional well-being.  Home and relationship well-being.  Sexual activity.  Eating habits.  Work and work Statistician.  Method of birth control.  Menstrual cycle.  Pregnancy history.  Screening You may have the following tests or measurements:  Height, weight, and BMI.  Blood pressure.  Lipid and cholesterol levels. These may be checked every 5 years, or more frequently if you are over 81 years old.  Skin check.  Lung cancer screening. You may have this screening every year starting at age 78 if you have a 30-pack-year history of smoking and currently smoke or have quit within the past 15 years.  Fecal occult blood test (FOBT) of the stool. You may have this test every year starting at age 65.  Flexible sigmoidoscopy or colonoscopy. You may have a sigmoidoscopy every 5 years or a colonoscopy  every 10 years starting at age 30.  Hepatitis C blood test.  Hepatitis B blood test.  Sexually transmitted disease (STD) testing.  Diabetes screening. This is done by checking your blood sugar (glucose) after you have not eaten for a while (fasting). You may have this done every 1-3 years.  Mammogram. This may be done every 1-2 years. Talk to your health care provider about when you should start having regular mammograms. This may depend on whether you have a family history of breast cancer.  BRCA-related cancer screening. This may be done if you have a family history of breast, ovarian, tubal, or peritoneal cancers.  Pelvic exam and Pap test. This may be done every 3 years starting at age 80. Starting at age 36, this may be done every 5 years if you have a Pap test in combination with an HPV test.  Bone density scan. This is done to screen for osteoporosis. You may have this scan if you are at high risk for osteoporosis.  Discuss your test results, treatment options, and if necessary, the need for more tests with your health care provider. Vaccines Your health care provider may recommend certain vaccines, such as:  Influenza vaccine. This is recommended every year.  Tetanus, diphtheria, and acellular pertussis (Tdap, Td) vaccine. You may need a Td booster every 10 years.  Varicella vaccine. You may need this if you have not been vaccinated.  Zoster vaccine. You may need this after age 5.  Measles, mumps, and rubella (MMR) vaccine. You may need at least one dose of MMR if you were born in  1958 or later. You may also need a second dose.  Pneumococcal 13-valent conjugate (PCV13) vaccine. You may need this if you have certain conditions and were not previously vaccinated.  Pneumococcal polysaccharide (PPSV23) vaccine. You may need one or two doses if you smoke cigarettes or if you have certain conditions.  Meningococcal vaccine. You may need this if you have certain  conditions.  Hepatitis A vaccine. You may need this if you have certain conditions or if you travel or work in places where you may be exposed to hepatitis A.  Hepatitis B vaccine. You may need this if you have certain conditions or if you travel or work in places where you may be exposed to hepatitis B.  Haemophilus influenzae type b (Hib) vaccine. You may need this if you have certain conditions.  Talk to your health care provider about which screenings and vaccines you need and how often you need them. This information is not intended to replace advice given to you by your health care provider. Make sure you discuss any questions you have with your health care provider. Document Released: 10/02/2015 Document Revised: 05/25/2016 Document Reviewed: 07/07/2015 Elsevier Interactive Patient Education  2018 [place] Prevention in the Home Falls can cause injuries and can affect people from all age groups. There are many simple things that you can do to make your home safe and to help prevent falls. What can I do on the outside of my home?  Regularly repair the edges of walkways and driveways and fix any cracks.  Remove high doorway thresholds.  Trim any shrubbery on the main path into your home.  Use bright outdoor lighting.  Clear walkways of debris and clutter, including tools and rocks.  Regularly check that handrails are securely fastened and in good repair. Both sides of any steps should have handrails.  Install guardrails along the edges of any raised decks or porches.  Have leaves, snow, and ice cleared regularly.  Use sand or salt on walkways during winter months.  In the garage, clean up any spills right away, including grease or oil spills. What can I do in the bathroom?  Use night lights.  Install grab bars by the toilet and in the tub and shower. Do not use towel bars as grab bars.  Use non-skid mats or decals on the floor of the tub or shower.  If you  need to sit down while you are in the shower, use a plastic, non-slip stool.  Keep the floor dry. Immediately clean up any water that spills on the floor.  Remove soap buildup in the tub or shower on a regular basis.  Attach bath mats securely with double-sided non-slip rug tape.  Remove throw rugs and other tripping hazards from the floor. What can I do in the bedroom?  Use night lights.  Make sure that a bedside light is easy to reach.  Do not use oversized bedding that drapes onto the floor.  Have a firm chair that has side arms to use for getting dressed.  Remove throw rugs and other tripping hazards from the floor. What can I do in the kitchen?  Clean up any spills right away.  Avoid walking on wet floors.  Place frequently used items in easy-to-reach places.  If you need to reach for something above you, use a sturdy step stool that has a grab bar.  Keep electrical cables out of the way.  Do not use floor polish or wax that makes floors  slippery. If you have to use wax, make sure that it is non-skid floor wax.  Remove throw rugs and other tripping hazards from the floor. What can I do in the stairways?  Do not leave any items on the stairs.  Make sure that there are handrails on both sides of the stairs. Fix handrails that are broken or loose. Make sure that handrails are as long as the stairways.  Check any carpeting to make sure that it is firmly attached to the stairs. Fix any carpet that is loose or worn.  Avoid having throw rugs at the top or bottom of stairways, or secure the rugs with carpet tape to prevent them from moving.  Make sure that you have a light switch at the top of the stairs and the bottom of the stairs. If you do not have them, have them installed. What are some other fall prevention tips?  Wear closed-toe shoes that fit well and support your feet. Wear shoes that have rubber soles or low heels.  When you use a stepladder, make sure that  it is completely opened and that the sides are firmly locked. Have someone hold the ladder while you are using it. Do not climb a closed stepladder.  Add color or contrast paint or tape to grab bars and handrails in your home. Place contrasting color strips on the first and last steps.  Use mobility aids as needed, such as canes, walkers, scooters, and crutches.  Turn on lights if it is dark. Replace any light bulbs that burn out.  Set up furniture so that there are clear paths. Keep the furniture in the same spot.  Fix any uneven floor surfaces.  Choose a carpet design that does not hide the edge of steps of a stairway.  Be aware of any and all pets.  Review your medicines with your healthcare provider. Some medicines can cause dizziness or changes in blood pressure, which increase your risk of falling. Talk with your health care provider about other ways that you can decrease your risk of falls. This may include working with a physical therapist or trainer to improve your strength, balance, and endurance. This information is not intended to replace advice given to you by your health care provider. Make sure you discuss any questions you have with your health care provider. Document Released: 08/26/2002 Document Revised: 02/02/2016 Document Reviewed: 10/10/2014 Elsevier Interactive Patient Education  2018 [employer]. Insomnia Insomnia is a sleep disorder that makes it difficult to fall asleep or to stay asleep. Insomnia can cause tiredness (fatigue), low energy, difficulty concentrating, mood swings, and poor performance at work or school. There are three different ways to classify insomnia:  Difficulty falling asleep.  Difficulty staying asleep.  Waking up too early in the morning.  Any type of insomnia can be long-term (chronic) or short-term (acute). Both are common. Short-term insomnia usually lasts for three months or less. Chronic insomnia occurs at least three times a week  for longer than three months. What are the causes? Insomnia may be caused by another condition, situation, or substance, such as:  Anxiety.  Certain medicines.  Gastroesophageal reflux disease (GERD) or other gastrointestinal conditions.  Asthma or other breathing conditions.  Restless legs syndrome, sleep apnea, or other sleep disorders.  Chronic pain.  Menopause. This may include hot flashes.  Stroke.  Abuse of alcohol, tobacco, or illegal drugs.  Depression.  Caffeine.  Neurological disorders, such as Alzheimer disease.  An overactive thyroid (hyperthyroidism).  The  cause of insomnia may not be known. What increases the risk? Risk factors for insomnia include:  Gender. Women are more commonly affected than men.  Age. Insomnia is more common as you get older.  Stress. This may involve your professional or personal life.  Income. Insomnia is more common in people with lower income.  Lack of exercise.  Irregular work schedule or night shifts.  Traveling between different time zones.  What are the signs or symptoms? If you have insomnia, trouble falling asleep or trouble staying asleep is the main symptom. This may lead to other symptoms, such as:  Feeling fatigued.  Feeling nervous about going to sleep.  Not feeling rested in the morning.  Having trouble concentrating.  Feeling irritable, anxious, or depressed.  How is this treated? Treatment for insomnia depends on the cause. If your insomnia is caused by an underlying condition, treatment will focus on addressing the condition. Treatment may also include:  Medicines to help you sleep.  Counseling or therapy.  Lifestyle adjustments.  Follow these instructions at home:  Take medicines only as directed by your health care provider.  Keep regular sleeping and waking hours. Avoid naps.  Keep a sleep diary to help you and your health care provider figure out what could be causing your insomnia.  Include: ? When you sleep. ? When you wake up during the night. ? How well you sleep. ? How rested you feel the next day. ? Any side effects of medicines you are taking. ? What you eat and drink.  Make your bedroom a comfortable place where it is easy to fall asleep: ? Put up shades or special blackout curtains to block light from outside. ? Use a white noise machine to block noise. ? Keep the temperature cool.  Exercise regularly as directed by your health care provider. Avoid exercising right before bedtime.  Use relaxation techniques to manage stress. Ask your health care provider to suggest some techniques that may work well for you. These may include: ? Breathing exercises. ? Routines to release muscle tension. ? Visualizing peaceful scenes.  Cut back on alcohol, caffeinated beverages, and cigarettes, especially close to bedtime. These can disrupt your sleep.  Do not overeat or eat spicy foods right before bedtime. This can lead to digestive discomfort that can make it hard for you to sleep.  Limit screen use before bedtime. This includes: ? Watching TV. ? Using your smartphone, tablet, and computer.  Stick to a routine. This can help you fall asleep faster. Try to do a quiet activity, brush your teeth, and go to bed at the same time each night.  Get out of bed if you are still awake after 15 minutes of trying to sleep. Keep the lights down, but try reading or doing a quiet activity. When you feel sleepy, go back to bed.  Make sure that you drive carefully. Avoid driving if you feel very sleepy.  Keep all follow-up appointments as directed by your health care provider. This is important. Contact a health care provider if:  You are tired throughout the day or have trouble in your daily routine due to sleepiness.  You continue to have sleep problems or your sleep problems get worse. Get help right away if:  You have serious thoughts about hurting yourself or someone  else. This information is not intended to replace advice given to you by your health care provider. Make sure you discuss any questions you have with your health care provider. Document  Released: 09/02/2000 Document Revised: 02/05/2016 Document Reviewed: 06/06/2014 Elsevier Interactive Patient Education  [employer].

## 2017-12-05 NOTE — Progress Notes (Addendum)
Subjective:  Patient ID: Patricia Duke, female    DOB: Jun 13, 1971  Age: 47 y.o. MRN: [id number]  CC: New Patient (Initial Visit)   HPI Patricia Duke presents for establishment of care and for treatment and follow-up of her chronic insomnia.  She tells me that she has been taking Xanax almost nightly since her youth.  She has a strong family history of anxiety in her father.  She does not drink alcohol or use illicit drugs.  She is fasting today.  She has been on the keto diet and lost 12 pounds.  She tells me that she has a history of a low HDL and an elevated LDL and she wants to see how this diet has affected those parameters.  She does not smoke, drink alcohol or use illicit drugs.  She is being seen by an orthopedist for chronic lower back and neck pain.  She is up-to-date on her Pap smears and mammograms.  She is she is scheduled for a Pap smear next month.  Her mother is 79 suffers from COPD, osteoarthritis.  She is uncertain about her father's health history.  She lives with her husband and 39 year old stepson.  She has no children of her own.  Maternal grandmother diagnosed with colon cancer in her 53s.  Patient does not snore as far she knows.   History Patricia Duke has a past medical history of Cervical strain (03/11/2015) and Headache disorder (03/11/2015).   She has a past surgical history that includes Rhinoplasty.   Her family history includes Colon cancer in her paternal grandmother; Stroke in her maternal grandmother.She reports that she quit smoking about 30 years ago. she has never used smokeless tobacco. She reports that she does not drink alcohol or use drugs.  Outpatient Medications Prior to Visit  Medication Sig Dispense Refill  . HYDROcodone-acetaminophen (NORCO/VICODIN) 5-325 MG tablet Take 1 tablet by mouth 2 (two) times daily as needed for moderate pain. 40 tablet 0  . metaxalone (SKELAXIN) 800 MG tablet TAKE 1 TABLET(800 MG) BY MOUTH AT BEDTIME    . mometasone (NASONEX) 50  MCG/ACT nasal spray Place 2 sprays into the nose daily.    . norethindrone-ethinyl estradiol 1/35 (NORTREL 1/35, 28,) tablet     . omeprazole (PRILOSEC) 40 MG capsule     . ALPRAZolam (XANAX) 0.5 MG tablet Take 1-1.5 tablets by mouth at bedtime.  0  . ALPRAZolam (XANAX) 0.5 MG tablet at bedtime as needed.    Marland Kitchen spironolactone (ALDACTONE) 50 MG tablet Take 150 mg by mouth daily.    Marland Kitchen gabapentin (NEURONTIN) 300 MG capsule TAKE 1 CAPSULE(300 MG) BY MOUTH AT BEDTIME 60 capsule 0  . Ibuprofen (ADVIL) 200 MG CAPS Take 600 mg by mouth 2 (two) times daily.     No facility-administered medications prior to visit.     ROS Review of Systems  Constitutional: Negative.   HENT: Negative.   Eyes: Negative for photophobia and visual disturbance.  Respiratory: Negative for apnea.   Cardiovascular: Negative.   Gastrointestinal: Negative.   Genitourinary: Negative.   Musculoskeletal: Positive for back pain, neck pain and neck stiffness.  Skin: Negative.   Allergic/Immunologic: Negative for immunocompromised state.  Neurological: Negative for weakness and headaches.  Hematological: Does not bruise/bleed easily.  Psychiatric/Behavioral: Positive for sleep disturbance.    Objective:  BP 102/62 (BP Location: Left Arm, Patient Position: Sitting, Cuff Size: Normal)   Pulse 90   Ht 5\' 4"  (1.626 m)   Wt 138 lb 12.8 oz (63  kg)   BMI 23.82 kg/m   Physical Exam  Constitutional: She is oriented to person, place, and time. She appears well-developed and well-nourished. No distress.  HENT:  Head: Normocephalic and atraumatic.  Right Ear: External ear normal.  Left Ear: External ear normal.  Mouth/Throat: Oropharynx is clear and moist. No oropharyngeal exudate.    Eyes: Conjunctivae are normal. Pupils are equal, round, and reactive to light. Right eye exhibits no discharge. Left eye exhibits no discharge. No scleral icterus.  Neck: Neck supple. No JVD present. No tracheal deviation present. No  thyromegaly present.  Cardiovascular: Normal rate, regular rhythm and normal heart sounds.  Pulmonary/Chest: Effort normal and breath sounds normal. No stridor.  Abdominal: Bowel sounds are normal.  Lymphadenopathy:    She has no cervical adenopathy.  Neurological: She is alert and oriented to person, place, and time.  Skin: Skin is warm and dry. She is not diaphoretic.  Psychiatric: She has a normal mood and affect. Her behavior is normal.      Assessment & Plan:   Patricia Duke was seen today for new patient (initial visit).  Diagnoses and all orders for this visit:  Healthcare maintenance -     CBC -     Comprehensive metabolic panel -     Lipid panel -     TSH -     Urinalysis, Routine w reflex microscopic -     HIV antibody  Primary insomnia -     ALPRAZolam (XANAX) 0.5 MG tablet; May take one at night as needed for sleep  Chronic low back pain, unspecified back pain laterality, with sciatica presence unspecified  Benzodiazepine dependence ([place])  Elevated LDL cholesterol level   I have discontinued Patricia Duke. Patricia Duke's Ibuprofen, spironolactone, gabapentin, and ALPRAZolam. I have also changed her ALPRAZolam. Additionally, I am having her maintain her mometasone, HYDROcodone-acetaminophen, norethindrone-ethinyl estradiol 1/35, metaxalone, and omeprazole.  Meds ordered this encounter  Medications  . ALPRAZolam (XANAX) 0.5 MG tablet    Sig: May take one at night as needed for sleep    Dispense:  30 tablet    Refill:  0     Follow-up: Return in about 3 months (around 03/07/2018).  Patient is clearly benzodiazepine dependent and I indicated that to her.  She did not disagree.  Let her know that I do not manage chronic pain and she will need to maintain her relationship with her current treating physician.  Female health is through GYN.  Follow-up scheduled in 3 months.  Libby Maw, MD

## 2017-12-06 ENCOUNTER — Other Ambulatory Visit: Payer: Self-pay

## 2017-12-06 DIAGNOSIS — E7800 Pure hypercholesterolemia, unspecified: Secondary | ICD-10-CM | POA: Insufficient documentation

## 2017-12-06 LAB — HIV ANTIBODY (ROUTINE TESTING W REFLEX): HIV 1&2 Ab, 4th Generation: NONREACTIVE

## 2017-12-08 ENCOUNTER — Telehealth: Payer: Self-pay | Admitting: Family Medicine

## 2017-12-08 NOTE — Telephone Encounter (Signed)
Pt called for lab values; verbalizes understanding.  Will call back to schedule 3 month F/U.

## 2017-12-12 ENCOUNTER — Telehealth: Payer: Self-pay | Admitting: Family Medicine

## 2017-12-12 DIAGNOSIS — E7800 Pure hypercholesterolemia, unspecified: Secondary | ICD-10-CM

## 2017-12-12 NOTE — Telephone Encounter (Signed)
Copied from [place]. Topic: Inquiry >> Dec 12, 2017  8:14 AM Margot Ables wrote: Pt asking if we check the particle # when checking for her cholesterol. Please advise as pt may want this added in 3 months for repeat labs. Also wanting to know if we received records from [place] for past levels.

## 2017-12-13 ENCOUNTER — Ambulatory Visit
Admission: RE | Admit: 2017-12-13 | Discharge: 2017-12-13 | Disposition: A | Discharge status: Home / Self Care | Payer: 59 | Source: Ambulatory Visit | Attending: Obstetrics and Gynecology | Admitting: Obstetrics and Gynecology

## 2017-12-13 DIAGNOSIS — Z1231 Encounter for screening mammogram for malignant neoplasm of breast: Secondary | ICD-10-CM

## 2017-12-13 NOTE — Addendum Note (Signed)
Addended by: Kateri Mc E on: 12/13/2017 11:52 AM   Modules accepted: Orders

## 2017-12-13 NOTE — Telephone Encounter (Signed)
I called and left patient a voicemail letting her know that we have not received her medical records yet. I also let her know that we did not test for the particles, but we can change the order for her repeat lipid panel to have the particles shown. Order entered.

## 2017-12-13 NOTE — Telephone Encounter (Signed)
Have not seen records.

## 2017-12-14 ENCOUNTER — Other Ambulatory Visit: Payer: Self-pay | Admitting: Family Medicine

## 2017-12-14 MED ORDER — MOMETASONE FUROATE 50 MCG/ACT NA SUSP: 2.0000 | Freq: Every day | NASAL | 1 refills | Status: AC

## 2017-12-14 NOTE — Telephone Encounter (Signed)
Copied from [place] [id number]. Topic: Quick Communication - Rx Refill/Question >> Dec 14, 2017  8:01 AM Synthia Innocent wrote: Medication: mometasone (NASONEX) 50 MCG/ACT nasal spray Has the patient contacted their pharmacy? No, previous PCP filled (Agent: If no, request that the patient contact the pharmacy for the refill.) Preferred Pharmacy (with phone number or street name): [facility] Agent: Please be advised that RX refills may take up to 3 business days. We ask that you follow-up with your pharmacy.

## 2017-12-14 NOTE — Telephone Encounter (Signed)
Rx refill request for historical medication: Nasonex 50 mcg/act  LOV: 12/05/17   PCP: [place]: verified

## 2017-12-21 ENCOUNTER — Other Ambulatory Visit: Payer: Self-pay | Admitting: Family Medicine

## 2017-12-21 MED ORDER — OMEPRAZOLE 40 MG PO CPDR: 40.0000 mg | DELAYED_RELEASE_CAPSULE | Freq: Every day | ORAL | 2 refills | Status: AC

## 2017-12-21 NOTE — Telephone Encounter (Signed)
OV  12/05/17 DR. Ethelene Hal

## 2017-12-21 NOTE — Telephone Encounter (Signed)
Copied from [place] [id number]. Topic: Quick Communication - Rx Refill/Question >> Dec 21, 2017  8:26 AM Margot Ables wrote: Medication: omeprazole (PRILOSEC) 40 MG capsule - has 7 left - takes 1/day Has the patient contacted their pharmacy? No - new pt to Dr. Ethelene Hal and he hasn't prescribed before Preferred Pharmacy (with phone number or street name): [facility] - [place], [place] AT [facility] [address] [phone] (Phone) [phone] (Fax)

## 2017-12-21 NOTE — Telephone Encounter (Signed)
Rx sent in

## 2018-01-05 ENCOUNTER — Other Ambulatory Visit: Payer: Self-pay | Admitting: Family Medicine

## 2018-01-05 DIAGNOSIS — F5101 Primary insomnia: Secondary | ICD-10-CM

## 2018-01-05 NOTE — Telephone Encounter (Signed)
>>   Jan 05, 2018 10:22 AM Oneta Rack wrote: Relation to pt: self Call back number: [phone] Pharmacy: [facility] [place], [address] AT [facility] [address] [phone] (Phone) [phone] (Fax)  Reason for call:  Patient checking on the status of ALPRAZolam Duanne Moron) 0.5 MG tablet request, patient states pharmacy faxed over request a week ago and will run out on Monday, please advise

## 2018-01-05 NOTE — Telephone Encounter (Signed)
Rx refill request: Alprazolam 0.5 mg  Ordered 3/19 #30  LOV: 12/05/17  PCP: [place]: verified

## 2018-01-08 MED ORDER — ALPRAZOLAM 0.5 MG PO TABS: ORAL_TABLET | ORAL | 0 refills | Status: DC

## 2018-01-08 NOTE — Addendum Note (Signed)
Addended by: Kateri Mc E on: 01/08/2018 09:39 AM   Modules accepted: Orders

## 2018-01-08 NOTE — Telephone Encounter (Signed)
Rx phoned in and left on the [employer].

## 2018-01-15 ENCOUNTER — Encounter: Payer: Self-pay | Admitting: Family Medicine

## 2018-02-01 ENCOUNTER — Other Ambulatory Visit: Payer: Self-pay | Admitting: Family Medicine

## 2018-02-01 DIAGNOSIS — F5101 Primary insomnia: Secondary | ICD-10-CM

## 2018-02-05 DIAGNOSIS — N760 Acute vaginitis: Secondary | ICD-10-CM | POA: Diagnosis not present

## 2018-02-05 DIAGNOSIS — Z01419 Encounter for gynecological examination (general) (routine) without abnormal findings: Secondary | ICD-10-CM | POA: Diagnosis not present

## 2018-02-05 DIAGNOSIS — Z6824 Body mass index (BMI) 24.0-24.9, adult: Secondary | ICD-10-CM | POA: Diagnosis not present

## 2018-02-27 ENCOUNTER — Other Ambulatory Visit: Payer: 59

## 2018-03-04 ENCOUNTER — Other Ambulatory Visit: Payer: Self-pay | Admitting: Family Medicine

## 2018-03-04 DIAGNOSIS — F5101 Primary insomnia: Secondary | ICD-10-CM

## 2018-03-05 NOTE — Telephone Encounter (Signed)
Pt needs to rtc fasting.

## 2018-03-06 ENCOUNTER — Other Ambulatory Visit (INDEPENDENT_AMBULATORY_CARE_PROVIDER_SITE_OTHER): Payer: 59

## 2018-03-06 ENCOUNTER — Telehealth: Payer: Self-pay

## 2018-03-06 DIAGNOSIS — E7800 Pure hypercholesterolemia, unspecified: Secondary | ICD-10-CM

## 2018-03-06 MED ORDER — ALPRAZOLAM 0.5 MG PO TABS: ORAL_TABLET | ORAL | 0 refills | Status: DC

## 2018-03-06 NOTE — Addendum Note (Signed)
Addended by: Jon Billings on: 03/06/2018 02:56 PM   Modules accepted: Orders

## 2018-03-06 NOTE — Telephone Encounter (Signed)
-----   Message from [name] sent at 03/06/2018  8:58 AM EDT ----- Good Morning, Patient would like a referral to see a specialist for testing for food allergies.  Patricia Duke

## 2018-03-06 NOTE — Telephone Encounter (Signed)
Patient has scheduled appointment, can you refill her Xanax since she is going out of town 6/20-6/23?

## 2018-03-06 NOTE — Telephone Encounter (Signed)
Okay for referral? I already left patient a voicemail letting her know that she needs an appointment with you.

## 2018-03-06 NOTE — Addendum Note (Signed)
Addended by: Kateri Mc E on: 03/06/2018 08:01 AM   Modules accepted: Orders

## 2018-03-06 NOTE — Telephone Encounter (Signed)
Patient calling and was unaware that the medication was refused. Medication refill appointment made for 03/13/18. Patient states that she will be going out of town for work on 03/08/18-02/22/22/19 and then leaving back out on 03/14/18-03/25/18. Patient would like to know if Dr Ethelene Hal could send enough medication to the pharmacy to last until her appointment. [id number] - [place], [address] AT [address] [place]

## 2018-03-12 LAB — CARDIO IQ ADV LIPID AND INFLAMM PNL
Apolipoprotein B: 100 mg/dL — ABNORMAL HIGH
CHOL/HDL RATIO: 4.9 calc (ref <5.0)
CHOLESTEROL: 180 mg/dL (ref <200)
HDL: 37 mg/dL — ABNORMAL LOW (ref >50)
HS-CRP: 6.8 mg/L — AB
LDL CHOLESTEROL (CALC): 125 mg/dL — AB (ref <100)
LDL LARGE: 5247 nmol/L (ref 3966–11938)
LDL Medium: 559 nmol/L — ABNORMAL HIGH (ref 122–498)
LDL PARTICLE NUMBER: 1885 nmol/L (ref 732–2035)
LDL Peak Size: 215.6 Angstrom — ABNORMAL LOW (ref >=217)
LDL Small: 470 nmol/L — ABNORMAL HIGH (ref 75–452)
Lipoprotein (a): 12 nmol/L (ref <75)
Non-HDL Cholesterol (Calc): 143 mg/dL (calc) — ABNORMAL HIGH (ref <130)
PLAC: 130 nmol/min/mL (ref 50–133)
TRIGLYCERIDES: 79 mg/dL (ref <150)

## 2018-03-13 ENCOUNTER — Telehealth: Payer: Self-pay

## 2018-03-13 ENCOUNTER — Ambulatory Visit: Payer: 59 | Admitting: Family Medicine

## 2018-03-13 NOTE — Telephone Encounter (Signed)
I left patient a voicemail letting her know that I will be out of the office for the rest of the day. I do have her test results on my desk, I asked patient to let me know when the best time to contact her tomorrow would be to go over these results in depth. If patient wants results today - okay for another nurse/cma in office to go over them with her. Results are sitting on my desk, on top of my computer. I will try contacting patient again tomorrow if she does not call back today.

## 2018-03-13 NOTE — Telephone Encounter (Signed)
Received fax with lab results this morning, will contact patient.     Copied from [place] [id number]. Topic: Quick Communication - Lab Results >> Mar 13, 2018 11:05 AM Patricia Duke wrote: Reason for CRM: pt called to get lab results read to her from the 18th of this month, call to advise

## 2018-03-14 NOTE — Telephone Encounter (Signed)
Called & informed patient of lab results obtained on 03/06/18. Per Dr. Ethelene Hal, he advised that the patient's cholesterol be treated. Patient voiced that she has made dietary changes since this past March & will like to discuss her results in more detail at the next scheduled office visit on 03/20/18. Prior to call ending, the patient did not have any further questions or concerns. Will mail lab results to the patient's home address.

## 2018-03-20 ENCOUNTER — Encounter: Payer: Self-pay | Admitting: Family Medicine

## 2018-03-20 ENCOUNTER — Ambulatory Visit: Payer: 59 | Admitting: Family Medicine

## 2018-03-20 VITALS — BP 102/70 | HR 99 | Ht 64.0 in | Wt 140.0 lb

## 2018-03-20 DIAGNOSIS — E7800 Pure hypercholesterolemia, unspecified: Secondary | ICD-10-CM | POA: Diagnosis not present

## 2018-03-20 DIAGNOSIS — F5101 Primary insomnia: Secondary | ICD-10-CM

## 2018-03-20 DIAGNOSIS — R69 Illness, unspecified: Secondary | ICD-10-CM | POA: Diagnosis not present

## 2018-03-20 MED ORDER — ALPRAZOLAM 0.5 MG PO TABS
ORAL_TABLET | ORAL | 0 refills | Status: AC
Start: 2018-03-20 — End: ?

## 2018-03-20 NOTE — Progress Notes (Signed)
Subjective:  Patient ID: Patricia Duke, female    DOB: 12/03/1970  Age: 47 y.o. MRN: [id number]  CC: Follow-up   HPI Patricia Duke presents for follow up of her ldl cholesterol. She has lowered the fat and cholesterol in her diet and has reduced her ldl from 199 to 125. She had requested a [facility] lipid analysis that suggested that her risk of vascular disease to be above average.  Patient is not interested in taking statin because she is concerned about the danger of doing so.  She tells me that her prior provider would refer her whenever she twisted to be referred.  Outpatient Medications Prior to Visit  Medication Sig Dispense Refill  . HYDROcodone-acetaminophen (NORCO/VICODIN) 5-325 MG tablet Take 1 tablet by mouth 2 (two) times daily as needed for moderate pain. 40 tablet 0  . metaxalone (SKELAXIN) 800 MG tablet TAKE 1 TABLET(800 MG) BY MOUTH AT BEDTIME    . mometasone (NASONEX) 50 MCG/ACT nasal spray Place 2 sprays into the nose daily. 17 g 1  . norethindrone-ethinyl estradiol 1/35 (NORTREL 1/35, 28,) tablet     . omeprazole (PRILOSEC) 40 MG capsule Take 1 capsule (40 mg total) by mouth daily. 90 capsule 2  . ALPRAZolam (XANAX) 0.5 MG tablet TAKE 1 TABLET BY MOUTH EVERY NIGHT AT BEDTIME AS NEEDED. 30 tablet 0   No facility-administered medications prior to visit.     ROS Review of Systems  Constitutional: Negative.   Respiratory: Negative.   Cardiovascular: Negative.   Gastrointestinal: Negative.   Psychiatric/Behavioral: Positive for sleep disturbance.    Objective:  BP 102/70   Pulse 99   Ht 5\' 4"  (1.626 m)   Wt 140 lb (63.5 kg)   SpO2 99%   BMI 24.03 kg/m   BP Readings from Last 3 Encounters:  03/20/18 102/70  12/05/17 102/62  03/11/15 104/70    Wt Readings from Last 3 Encounters:  03/20/18 140 lb (63.5 kg)  12/05/17 138 lb 12.8 oz (63 kg)  03/11/15 141 lb 3.2 oz (64 kg)    Physical Exam  Constitutional: She is oriented to person, place, and  time. She appears well-developed and well-nourished. No distress.  Eyes: Right eye exhibits no discharge. Left eye exhibits no discharge. No scleral icterus.  Pulmonary/Chest: Effort normal.  Neurological: She is alert and oriented to person, place, and time.  Skin: She is not diaphoretic.  Psychiatric: She has a normal mood and affect. Her behavior is normal.    Lab Results  Component Value Date   WBC 5.5 12/05/2017   HGB 13.8 12/05/2017   HCT 40.9 12/05/2017   PLT 286.0 12/05/2017   GLUCOSE 113 (H) 12/05/2017   CHOL 180 03/06/2018   TRIG 79 03/06/2018   HDL 37 (L) 03/06/2018   LDLCALC 125 (H) 03/06/2018   ALT 10 12/05/2017   AST 12 12/05/2017   NA 136 12/05/2017   K 4.6 12/05/2017   CL 103 12/05/2017   CREATININE 0.63 12/05/2017   BUN 16 12/05/2017   CO2 27 12/05/2017   TSH 1.19 12/05/2017    Mm Screening Breast W/implant Tomo Bilateral  Result Date: 12/13/2017 CLINICAL DATA:  Screening. EXAM: DIGITAL SCREENING BILATERAL MAMMOGRAM WITH IMPLANTS, CAD AND TOMO The patient has retroglandular implants. Standard and implant displaced views were performed. COMPARISON:  Previous exam(s). ACR Breast Density Category c: The breast tissue is heterogeneously dense, which may obscure small masses. FINDINGS: There are no findings suspicious for malignancy. Images were processed with  CAD. IMPRESSION: No mammographic evidence of malignancy. A result letter of this screening mammogram will be mailed directly to the patient. RECOMMENDATION: Screening mammogram in one year. (Code:SM-B-01Y) BI-RADS CATEGORY  1:  Negative. Electronically Signed   By: Trude Mcburney M.D.   On: 12/13/2017 16:13    Assessment & Plan:   Patricia Duke was seen today for follow-up.  Diagnoses and all orders for this visit:  Elevated LDL cholesterol level -     Ambulatory referral to Cardiology  Primary insomnia -     ALPRAZolam (XANAX) 0.5 MG tablet; TAKE 1 TABLET BY MOUTH EVERY NIGHT AT BEDTIME AS NEEDED.   I am  having Patricia Duke maintain her HYDROcodone-acetaminophen, norethindrone-ethinyl estradiol 1/35, metaxalone, mometasone, omeprazole, and ALPRAZolam.  Meds ordered this encounter  Medications  . ALPRAZolam (XANAX) 0.5 MG tablet    Sig: TAKE 1 TABLET BY MOUTH EVERY NIGHT AT BEDTIME AS NEEDED.    Dispense:  30 tablet    Refill:  0   Based on current lipid profile, I recommended treatment. Pt requested referral to cardiology for a second opinion. I agreed to do so.  I asked her what her most likely cause of death would be and she told me that she felt as though that was a trick question or a loaded question. I told her that it was neither. I feel as though there is strain in our doctor patient relationship and asked her where we should go from here. She said that she would consider finding a new provider. I think that probably would be the right thing to do.   Follow-up: No follow-ups on file.  Libby Maw, MD

## 2018-03-30 DIAGNOSIS — E782 Mixed hyperlipidemia: Secondary | ICD-10-CM | POA: Diagnosis not present

## 2018-03-30 DIAGNOSIS — K219 Gastro-esophageal reflux disease without esophagitis: Secondary | ICD-10-CM | POA: Diagnosis not present

## 2018-03-30 DIAGNOSIS — J3489 Other specified disorders of nose and nasal sinuses: Secondary | ICD-10-CM | POA: Diagnosis not present

## 2018-04-24 DIAGNOSIS — Z87891 Personal history of nicotine dependence: Secondary | ICD-10-CM | POA: Diagnosis not present

## 2018-04-24 DIAGNOSIS — J343 Hypertrophy of nasal turbinates: Secondary | ICD-10-CM | POA: Diagnosis not present

## 2018-04-24 DIAGNOSIS — G5139 Clonic hemifacial spasm, unspecified: Secondary | ICD-10-CM | POA: Diagnosis not present

## 2018-04-24 DIAGNOSIS — J3489 Other specified disorders of nose and nasal sinuses: Secondary | ICD-10-CM | POA: Diagnosis not present

## 2018-04-24 DIAGNOSIS — J341 Cyst and mucocele of nose and nasal sinus: Secondary | ICD-10-CM | POA: Diagnosis not present

## 2018-05-09 DIAGNOSIS — G894 Chronic pain syndrome: Secondary | ICD-10-CM | POA: Diagnosis not present

## 2018-10-03 DIAGNOSIS — L689 Hypertrichosis, unspecified: Secondary | ICD-10-CM | POA: Diagnosis not present

## 2018-10-03 DIAGNOSIS — M545 Low back pain: Secondary | ICD-10-CM | POA: Diagnosis not present

## 2018-10-03 DIAGNOSIS — E782 Mixed hyperlipidemia: Secondary | ICD-10-CM | POA: Diagnosis not present

## 2018-10-03 DIAGNOSIS — R7982 Elevated C-reactive protein (CRP): Secondary | ICD-10-CM | POA: Diagnosis not present

## 2018-10-03 DIAGNOSIS — G4700 Insomnia, unspecified: Secondary | ICD-10-CM | POA: Diagnosis not present

## 2018-10-03 DIAGNOSIS — M542 Cervicalgia: Secondary | ICD-10-CM | POA: Diagnosis not present

## 2018-10-03 DIAGNOSIS — Z0000 Encounter for general adult medical examination without abnormal findings: Secondary | ICD-10-CM | POA: Diagnosis not present

## 2018-10-31 ENCOUNTER — Other Ambulatory Visit: Payer: Self-pay | Admitting: Obstetrics and Gynecology

## 2018-10-31 DIAGNOSIS — Z1231 Encounter for screening mammogram for malignant neoplasm of breast: Secondary | ICD-10-CM

## 2018-12-25 ENCOUNTER — Ambulatory Visit: Payer: 59

## 2019-02-01 DIAGNOSIS — M5136 Other intervertebral disc degeneration, lumbar region: Secondary | ICD-10-CM | POA: Diagnosis not present

## 2019-02-01 DIAGNOSIS — G894 Chronic pain syndrome: Secondary | ICD-10-CM | POA: Diagnosis not present

## 2019-02-01 DIAGNOSIS — M47816 Spondylosis without myelopathy or radiculopathy, lumbar region: Secondary | ICD-10-CM | POA: Diagnosis not present

## 2019-02-13 ENCOUNTER — Ambulatory Visit: Payer: 59

## 2019-03-12 DIAGNOSIS — Z309 Encounter for contraceptive management, unspecified: Secondary | ICD-10-CM | POA: Diagnosis not present

## 2019-03-12 DIAGNOSIS — Z6823 Body mass index (BMI) 23.0-23.9, adult: Secondary | ICD-10-CM | POA: Diagnosis not present

## 2019-03-12 DIAGNOSIS — Z01419 Encounter for gynecological examination (general) (routine) without abnormal findings: Secondary | ICD-10-CM | POA: Diagnosis not present

## 2019-03-12 DIAGNOSIS — N760 Acute vaginitis: Secondary | ICD-10-CM | POA: Diagnosis not present

## 2019-03-28 DIAGNOSIS — M2550 Pain in unspecified joint: Secondary | ICD-10-CM | POA: Diagnosis not present

## 2019-03-28 DIAGNOSIS — R739 Hyperglycemia, unspecified: Secondary | ICD-10-CM | POA: Diagnosis not present

## 2019-03-28 DIAGNOSIS — E782 Mixed hyperlipidemia: Secondary | ICD-10-CM | POA: Diagnosis not present

## 2019-03-28 DIAGNOSIS — G4700 Insomnia, unspecified: Secondary | ICD-10-CM | POA: Diagnosis not present

## 2019-03-28 DIAGNOSIS — R7982 Elevated C-reactive protein (CRP): Secondary | ICD-10-CM | POA: Diagnosis not present

## 2019-04-03 ENCOUNTER — Other Ambulatory Visit: Payer: Self-pay

## 2019-04-03 ENCOUNTER — Ambulatory Visit
Admission: RE | Admit: 2019-04-03 | Discharge: 2019-04-03 | Disposition: A | Discharge status: Home / Self Care | Payer: 59 | Source: Ambulatory Visit | Attending: Obstetrics and Gynecology | Admitting: Obstetrics and Gynecology

## 2019-04-03 DIAGNOSIS — Z1231 Encounter for screening mammogram for malignant neoplasm of breast: Secondary | ICD-10-CM | POA: Diagnosis not present

## 2020-02-26 ENCOUNTER — Ambulatory Visit: Payer: 59 | Admitting: Dermatology

## 2020-03-02 ENCOUNTER — Ambulatory Visit: Payer: 59 | Admitting: Physician Assistant

## 2020-03-19 ENCOUNTER — Other Ambulatory Visit: Payer: Self-pay | Admitting: Obstetrics and Gynecology

## 2020-03-19 DIAGNOSIS — Z1231 Encounter for screening mammogram for malignant neoplasm of breast: Secondary | ICD-10-CM

## 2020-04-08 ENCOUNTER — Ambulatory Visit
Admission: RE | Admit: 2020-04-08 | Discharge: 2020-04-08 | Disposition: A | Discharge status: Home / Self Care | Payer: Self-pay | Source: Ambulatory Visit | Attending: Obstetrics and Gynecology | Admitting: Obstetrics and Gynecology

## 2020-04-08 ENCOUNTER — Other Ambulatory Visit: Payer: Self-pay

## 2020-04-08 DIAGNOSIS — Z1231 Encounter for screening mammogram for malignant neoplasm of breast: Secondary | ICD-10-CM

## 2020-04-13 ENCOUNTER — Other Ambulatory Visit: Payer: Self-pay | Admitting: Obstetrics and Gynecology

## 2020-04-13 ENCOUNTER — Ambulatory Visit
Admission: RE | Admit: 2020-04-13 | Discharge: 2020-04-13 | Disposition: A | Discharge status: Home / Self Care | Payer: Self-pay | Source: Ambulatory Visit | Attending: Obstetrics and Gynecology | Admitting: Obstetrics and Gynecology

## 2020-04-13 ENCOUNTER — Other Ambulatory Visit: Payer: Self-pay

## 2020-04-13 ENCOUNTER — Ambulatory Visit
Admission: RE | Admit: 2020-04-13 | Discharge: 2020-04-13 | Disposition: A | Discharge status: Home / Self Care | Payer: No Typology Code available for payment source | Source: Ambulatory Visit | Attending: Obstetrics and Gynecology | Admitting: Obstetrics and Gynecology

## 2020-04-13 DIAGNOSIS — R928 Other abnormal and inconclusive findings on diagnostic imaging of breast: Secondary | ICD-10-CM

## 2020-05-04 ENCOUNTER — Other Ambulatory Visit: Payer: Self-pay

## 2021-01-04 ENCOUNTER — Other Ambulatory Visit: Payer: Self-pay | Admitting: Obstetrics and Gynecology

## 2021-01-04 DIAGNOSIS — Z1231 Encounter for screening mammogram for malignant neoplasm of breast: Secondary | ICD-10-CM

## 2021-04-13 ENCOUNTER — Other Ambulatory Visit: Payer: Self-pay

## 2021-04-13 ENCOUNTER — Ambulatory Visit
Admission: RE | Admit: 2021-04-13 | Discharge: 2021-04-13 | Disposition: A | Discharge status: Home / Self Care | Payer: No Typology Code available for payment source | Source: Ambulatory Visit | Attending: Obstetrics and Gynecology | Admitting: Obstetrics and Gynecology

## 2021-04-13 DIAGNOSIS — Z1231 Encounter for screening mammogram for malignant neoplasm of breast: Secondary | ICD-10-CM

## 2021-06-02 ENCOUNTER — Other Ambulatory Visit: Payer: Self-pay

## 2021-06-02 ENCOUNTER — Ambulatory Visit (INDEPENDENT_AMBULATORY_CARE_PROVIDER_SITE_OTHER): Payer: No Typology Code available for payment source | Admitting: Dermatology

## 2021-06-02 ENCOUNTER — Encounter: Payer: Self-pay | Admitting: Dermatology

## 2021-06-02 DIAGNOSIS — D369 Benign neoplasm, unspecified site: Secondary | ICD-10-CM

## 2021-06-02 DIAGNOSIS — D367 Benign neoplasm of other specified sites: Secondary | ICD-10-CM

## 2021-06-02 DIAGNOSIS — L821 Other seborrheic keratosis: Secondary | ICD-10-CM

## 2021-06-02 DIAGNOSIS — L815 Leukoderma, not elsewhere classified: Secondary | ICD-10-CM

## 2021-06-02 DIAGNOSIS — Z1283 Encounter for screening for malignant neoplasm of skin: Secondary | ICD-10-CM

## 2021-06-02 DIAGNOSIS — R21 Rash and other nonspecific skin eruption: Secondary | ICD-10-CM

## 2021-06-02 NOTE — Progress Notes (Addendum)
   New Patient   Subjective  Patricia Duke is a 50 y.o. female who presents for the following: Annual Exam (Left arm- raised x years- its stings, melasma on right forehead- tx- does use sunscreen, chest & arms- every since AFT I get tiny bumps- will smooth out when I microderm, right arm- wart, right & left shoulder- get dermatitis really bad- tx- none).  General skin examination, several spots to check. Location:  Duration:  Quality:  Associated Signs/Symptoms: Modifying Factors:  Severity:  Timing: Context:    The following portions of the chart were reviewed this encounter and updated as appropriate:      Objective  Well appearing patient in no apparent distress; mood and affect are within normal limits. General skin examination, no atypical pigmented lesions or nonmelanoma skin cancer.  Left Leg, Right Leg, Right Lower Leg - Anterior 4 dozen 3 mm hypopigmented macules particularly arms and legs.  Mid Forehead Subtle dermatitis, more likely seborrheic dermatitis then contact.  Left Forearm - Posterior 2 mm pink papilloma compatible with wart  Right Ankle - Anterior 3 mm textured papule   A full examination was performed including scalp, head, eyes, ears, nose, lips, neck, chest, axillae, abdomen, back, buttocks, bilateral upper extremities, bilateral lower extremities, hands, feet, fingers, toes, fingernails, and toenails. All findings within normal limits unless otherwise noted below.  Areas beneath undergarments not fully examined.   Assessment & Plan  Encounter for screening for malignant neoplasm of skin  Annual skin examination, patient encouraged to self examine twice annually.  Continue ultraviolet protection.  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  Rash and other nonspecific skin eruption Mid Forehead  Over the counter cerave anti- itch & hydrocortisone ointment   Papilloma Left Forearm  - Posterior  Over the counter hydrocortisone ointment if there is itching or may try an over-the-counter wart freeze like Verruca-Freeze  Seborrheic keratosis Right Ankle - Anterior  Leave if stable

## 2021-06-03 ENCOUNTER — Ambulatory Visit: Payer: No Typology Code available for payment source | Admitting: Dermatology

## 2021-06-10 ENCOUNTER — Encounter: Payer: Self-pay | Admitting: Dermatology

## 2021-09-20 IMAGING — MG DIGITAL SCREENING BREAST BILAT IMPLANT W/ TOMO W/ CAD
9 of 12 series · 9 of 28 positions shown · non-contrast
Comparison: April 03, 2019 and earlier

CLINICAL DATA: Screening.

EXAM:
DIGITAL SCREENING BILATERAL MAMMOGRAM WITH IMPLANTS, CAD AND TOMO
The patient has retroglandular saline implants. Standard and implant
displaced views were performed.

[L MLO]
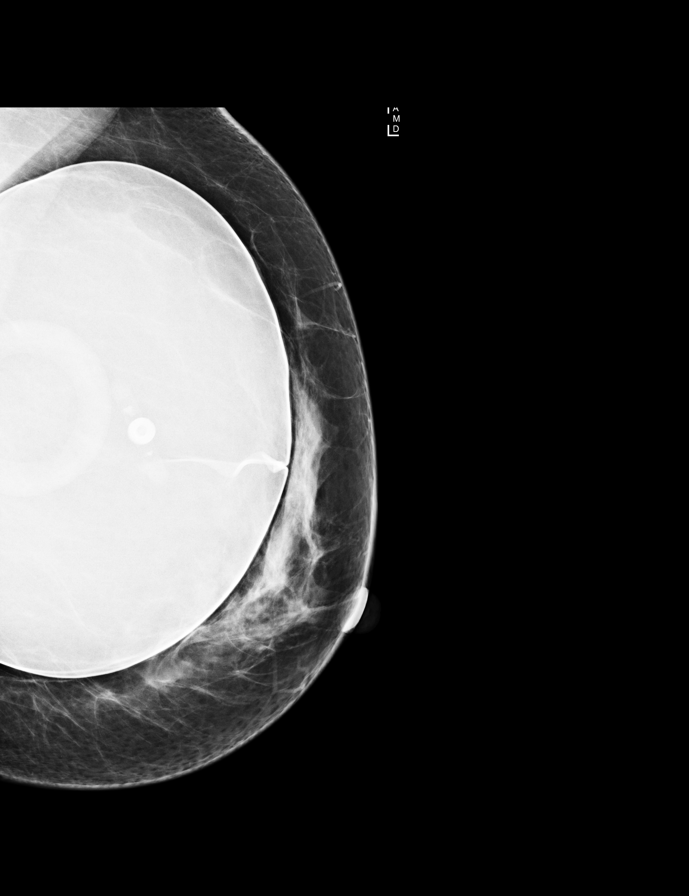

[R MLO]
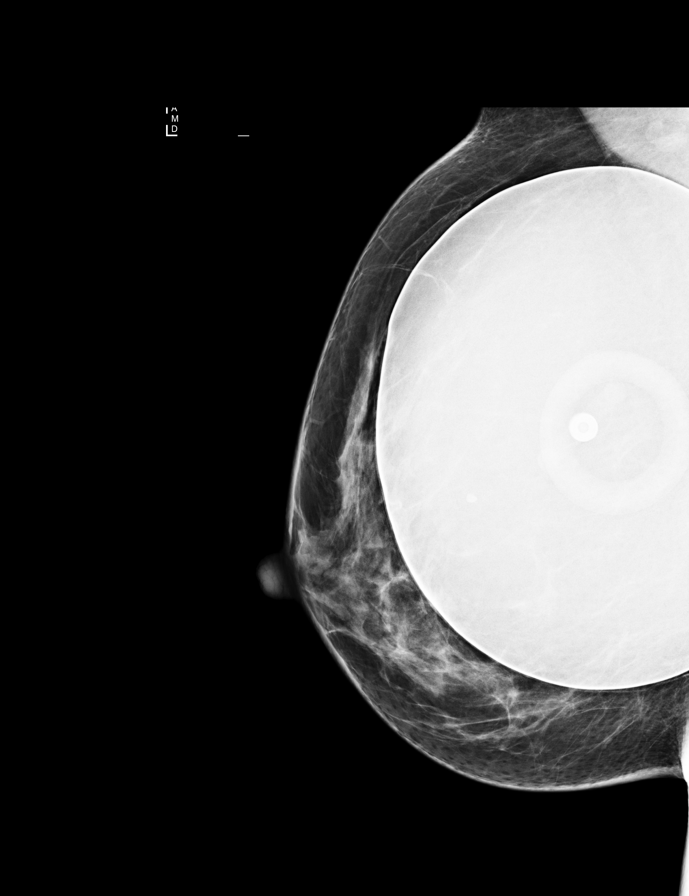

[R CC]
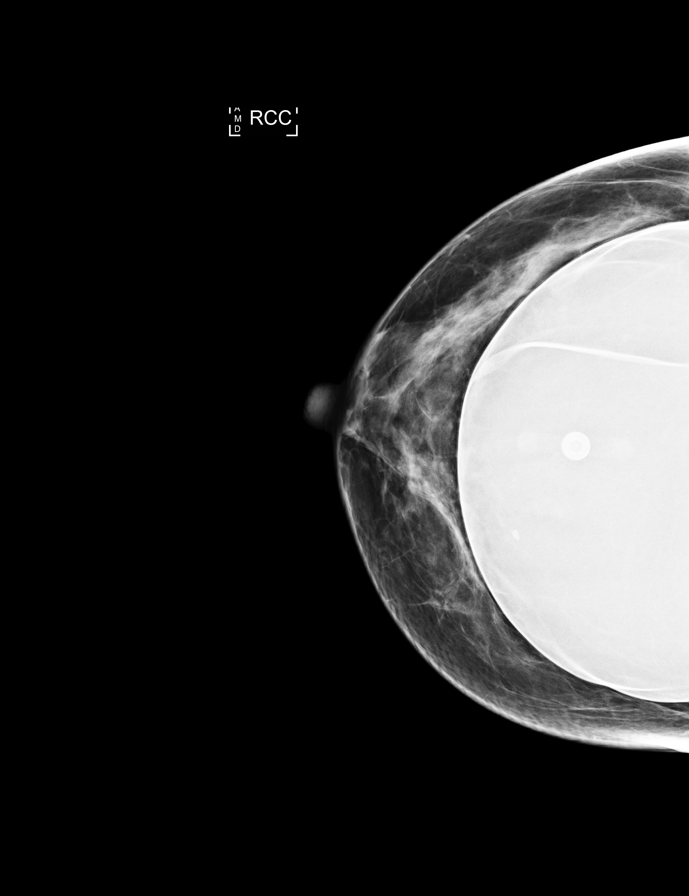

[L CC]
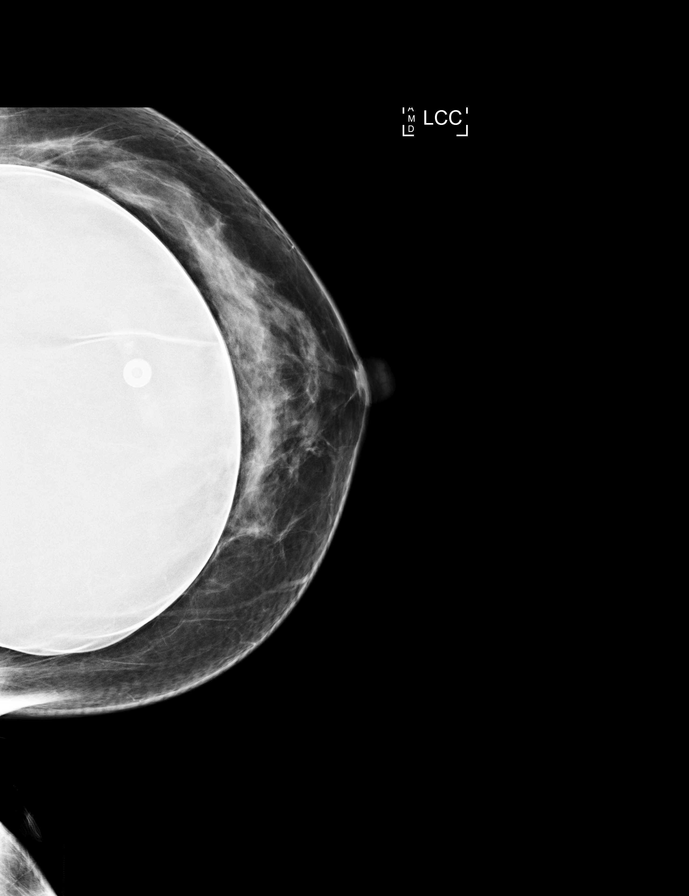

[R MLO synth-2D]
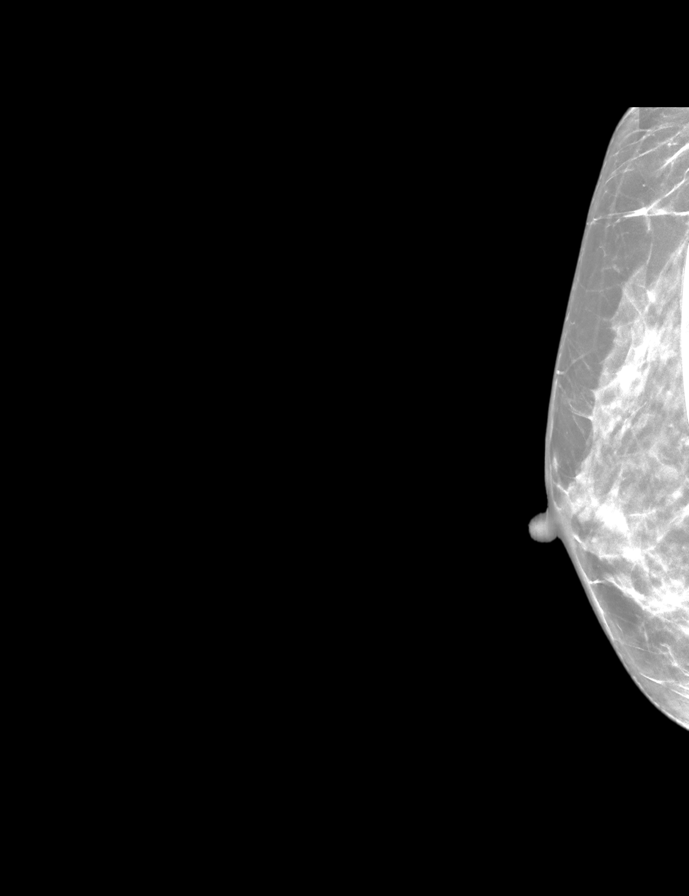

[L MLO synth-2D]
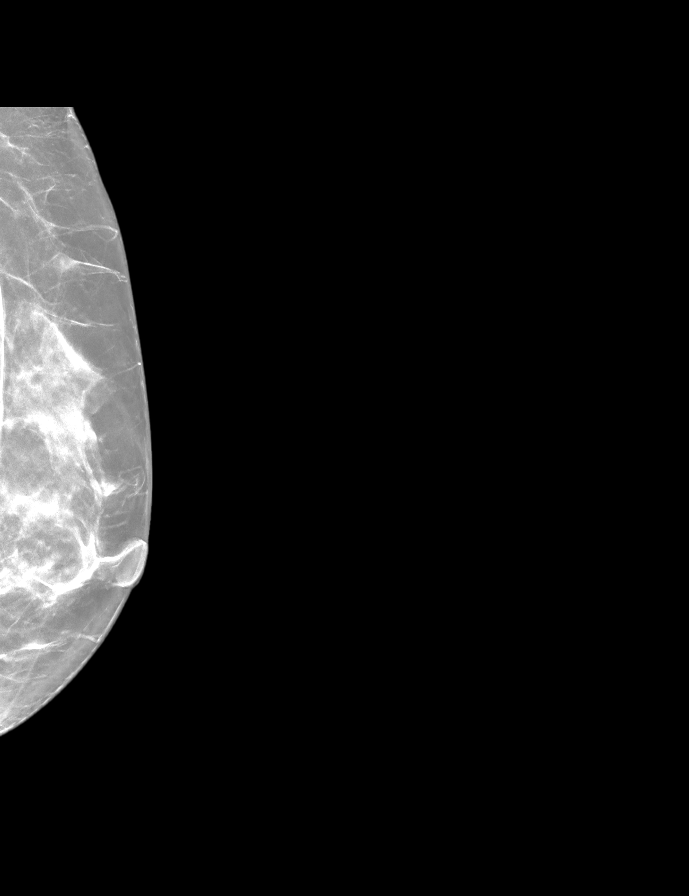

[R CC synth-2D]
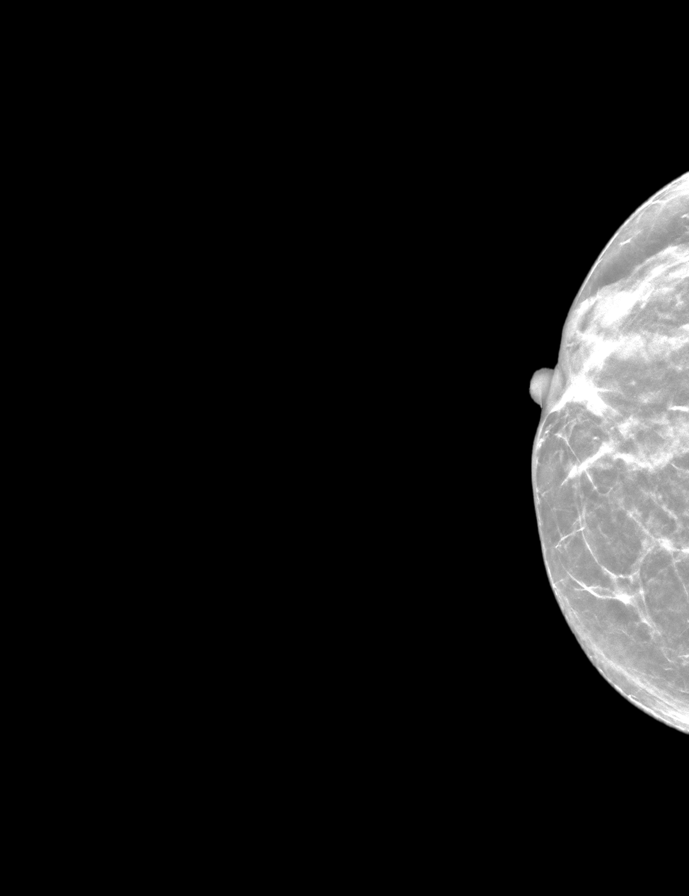

[L CC synth-2D]
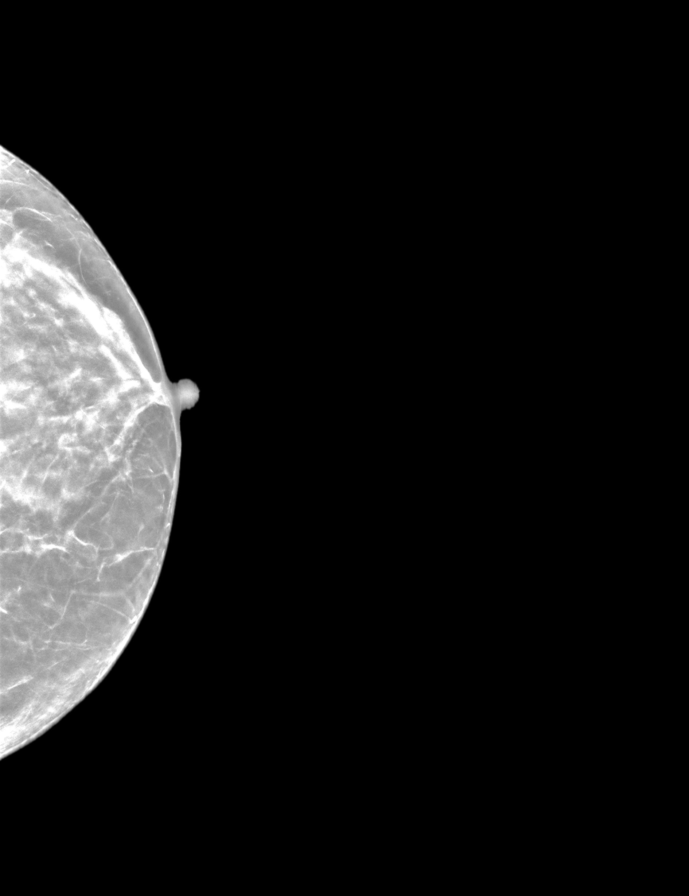

[R MLOID BREAST TOMOSYNTHESIS IMAGE tomo · tomo slice 27/52.0]
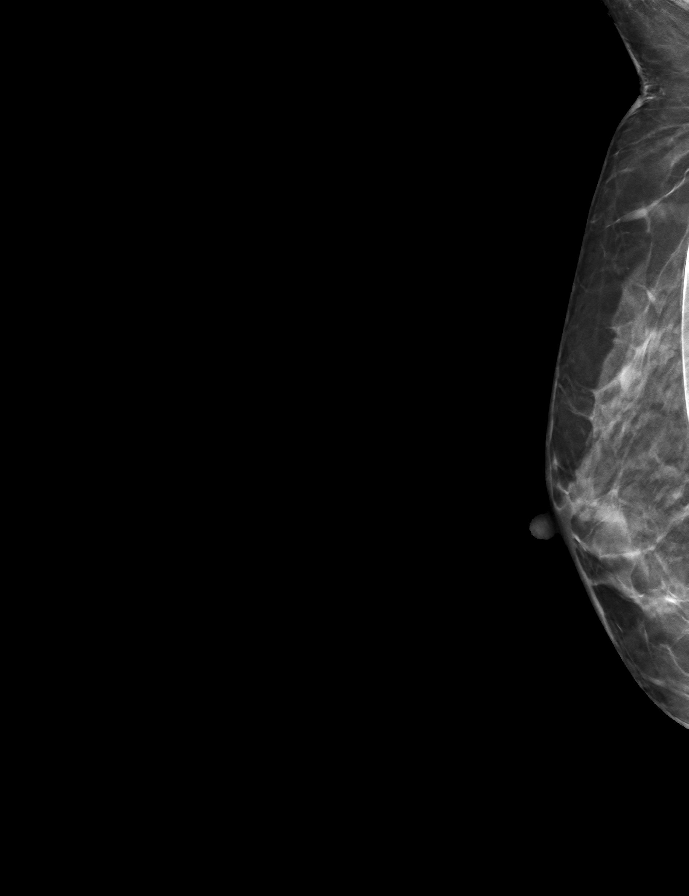

[9 of 28 positions shown; findings below may reference images not displayed]

ACR Breast Density Category c: The breast tissue is heterogeneously
dense, which may obscure small masses.
FINDINGS: In the left breast, a possible asymmetry with questionable
associated architectural distortion is seen on displaced MLO view
only. It is best seen on MLO slice forty-six. It is seen in the
upper outer breast. In the right breast, no suspicious masses or
malignant type calcifications are identified. Images were processed
with CAD.
IMPRESSION: Further evaluation is suggested for possible asymmetry in the left
breast seen on MLO view only.

RECOMMENDATION:
Diagnostic mammogram and possibly ultrasound of the left breast.
(Code:65-S-LLI)

The patient will be contacted regarding the findings, and additional
imaging will be scheduled.

BI-RADS CATEGORY  0: Incomplete. Need additional imaging evaluation
and/or prior mammograms for comparison.

## 2021-11-29 ENCOUNTER — Ambulatory Visit (INDEPENDENT_AMBULATORY_CARE_PROVIDER_SITE_OTHER): Payer: Self-pay | Admitting: Dermatology

## 2021-11-29 ENCOUNTER — Other Ambulatory Visit: Payer: Self-pay

## 2021-11-29 ENCOUNTER — Encounter: Payer: Self-pay | Admitting: Dermatology

## 2021-11-29 DIAGNOSIS — L700 Acne vulgaris: Secondary | ICD-10-CM

## 2021-11-29 MED ORDER — TRETINOIN 0.025 % EX CREA: TOPICAL_CREAM | Freq: Every day | CUTANEOUS | 0 refills | Status: DC

## 2021-11-29 NOTE — Patient Instructions (Addendum)
OTC Pramoxine (Cera-Ve) ? ?OTC cortisone 3 to 6 weeks, if not better use the tretinoin (for upper abs) ?

## 2021-11-29 NOTE — Progress Notes (Signed)
? ?  Follow-Up Visit ?  ?Subjective  ?Patricia Duke is a 51 y.o. female who presents for the following: Annual Exam (Lesion on stomach x months. ). ? ?Bumps on abdomen, sensitive skin chest ?Location:  ?Duration:  ?Quality:  ?Associated Signs/Symptoms: ?Modifying Factors:  ?Severity:  ?Timing: ?Context:  ? ?Objective  ?Well appearing patient in no apparent distress; mood and affect are within normal limits. ? ? ?A focused examination was performed including chest and abdomen. . Relevant physical exam findings are noted in the Assessment and Plan. ? ? ?Assessment & Plan  ? ? ?Acne vulgaris ?Chest - Medial [facility]); Left Abdomen (side) - Upper; Right Abdomen (side) - Upper ? ?tretinoin (RETIN-A) 0.025 % cream - Chest - Medial [facility]), Left Abdomen (side) - Upper, Right Abdomen (side) - Upper ?Apply topically at bedtime. ? ? ? ? ? ?I, Lavonna Monarch, MD, have reviewed all documentation for this visit.  The documentation on 11/29/21 for the exam, diagnosis, procedures, and orders are all accurate and complete. ?

## 2022-01-21 ENCOUNTER — Other Ambulatory Visit: Payer: Self-pay | Admitting: Obstetrics and Gynecology

## 2022-01-21 DIAGNOSIS — Z1231 Encounter for screening mammogram for malignant neoplasm of breast: Secondary | ICD-10-CM

## 2022-02-07 ENCOUNTER — Other Ambulatory Visit: Payer: Self-pay | Admitting: Dermatology

## 2022-02-07 DIAGNOSIS — L700 Acne vulgaris: Secondary | ICD-10-CM

## 2022-02-16 ENCOUNTER — Telehealth: Payer: Self-pay | Admitting: Orthopaedic Surgery

## 2022-02-16 NOTE — Telephone Encounter (Signed)
Please advise-pt needs f/u to discuss correct?

## 2022-02-16 NOTE — Telephone Encounter (Signed)
Patient called advised she has been called for jury duty and asked if she can get a copy of the letter Dr. Ninfa Linden wrote stating she can not sit that long. Patient said the letter was written a while back. The number to contact patient is [phone]

## 2022-03-14 ENCOUNTER — Ambulatory Visit (INDEPENDENT_AMBULATORY_CARE_PROVIDER_SITE_OTHER): Payer: Self-pay | Admitting: Orthopaedic Surgery

## 2022-03-14 DIAGNOSIS — G8929 Other chronic pain: Secondary | ICD-10-CM

## 2022-03-14 DIAGNOSIS — M545 Low back pain, unspecified: Secondary | ICD-10-CM

## 2022-04-13 ENCOUNTER — Ambulatory Visit
Admission: RE | Admit: 2022-04-13 | Discharge: 2022-04-13 | Disposition: A | Discharge status: Home / Self Care | Payer: No Typology Code available for payment source | Source: Ambulatory Visit | Attending: Obstetrics and Gynecology | Admitting: Obstetrics and Gynecology

## 2022-04-13 DIAGNOSIS — Z1231 Encounter for screening mammogram for malignant neoplasm of breast: Secondary | ICD-10-CM

## 2022-04-14 ENCOUNTER — Ambulatory Visit: Payer: Self-pay

## 2023-01-23 ENCOUNTER — Other Ambulatory Visit: Payer: Self-pay | Admitting: Obstetrics and Gynecology

## 2023-01-23 DIAGNOSIS — Z1231 Encounter for screening mammogram for malignant neoplasm of breast: Secondary | ICD-10-CM

## 2023-04-26 ENCOUNTER — Ambulatory Visit
Admission: RE | Admit: 2023-04-26 | Discharge: 2023-04-26 | Disposition: A | Discharge status: Home / Self Care | Payer: No Typology Code available for payment source | Source: Ambulatory Visit | Attending: Obstetrics and Gynecology | Admitting: Obstetrics and Gynecology

## 2023-04-26 DIAGNOSIS — Z1231 Encounter for screening mammogram for malignant neoplasm of breast: Secondary | ICD-10-CM

## 2024-03-25 ENCOUNTER — Other Ambulatory Visit: Payer: Self-pay | Admitting: Obstetrics and Gynecology

## 2024-03-25 DIAGNOSIS — Z1231 Encounter for screening mammogram for malignant neoplasm of breast: Secondary | ICD-10-CM

## 2024-07-09 ENCOUNTER — Ambulatory Visit
Admission: RE | Admit: 2024-07-09 | Discharge: 2024-07-09 | Disposition: A | Source: Ambulatory Visit | Attending: Obstetrics and Gynecology | Admitting: Obstetrics and Gynecology

## 2024-07-09 DIAGNOSIS — Z1231 Encounter for screening mammogram for malignant neoplasm of breast: Secondary | ICD-10-CM

## 2024-07-11 ENCOUNTER — Other Ambulatory Visit: Payer: Self-pay | Admitting: Obstetrics and Gynecology

## 2024-07-11 DIAGNOSIS — Z1231 Encounter for screening mammogram for malignant neoplasm of breast: Secondary | ICD-10-CM
